# Patient Record
Sex: Female | Born: 1960 | Race: White | Hispanic: No | Marital: Married | Smoking: Never smoker
Health system: Southern US, Community
[De-identification: ages and names within clinical notes are randomized; demographics above are authoritative.]

## PROBLEM LIST (undated history)

## (undated) DIAGNOSIS — L237 Allergic contact dermatitis due to plants, except food: Secondary | ICD-10-CM

## (undated) DIAGNOSIS — K219 Gastro-esophageal reflux disease without esophagitis: Secondary | ICD-10-CM

## (undated) HISTORY — DX: Allergic contact dermatitis due to plants, except food: L23.7

## (undated) HISTORY — PX: TONSILLECTOMY AND ADENOIDECTOMY: SUR1326

## (undated) HISTORY — DX: Gastro-esophageal reflux disease without esophagitis: K21.9

---

## 1998-06-27 ENCOUNTER — Other Ambulatory Visit: Admission: RE | Admit: 1998-06-27 | Discharge: 1998-06-27 | Payer: Self-pay | Admitting: Obstetrics and Gynecology

## 1999-08-20 ENCOUNTER — Other Ambulatory Visit: Admission: RE | Admit: 1999-08-20 | Discharge: 1999-08-20 | Payer: Self-pay | Admitting: *Deleted

## 2000-08-18 ENCOUNTER — Other Ambulatory Visit: Admission: RE | Admit: 2000-08-18 | Discharge: 2000-08-18 | Payer: Self-pay | Admitting: Obstetrics and Gynecology

## 2001-08-27 ENCOUNTER — Other Ambulatory Visit: Admission: RE | Admit: 2001-08-27 | Discharge: 2001-08-27 | Payer: Self-pay | Admitting: Obstetrics and Gynecology

## 2002-09-01 ENCOUNTER — Other Ambulatory Visit: Admission: RE | Admit: 2002-09-01 | Discharge: 2002-09-01 | Payer: Self-pay | Admitting: Obstetrics and Gynecology

## 2003-09-20 ENCOUNTER — Other Ambulatory Visit: Admission: RE | Admit: 2003-09-20 | Discharge: 2003-09-20 | Payer: Self-pay | Admitting: Obstetrics and Gynecology

## 2004-09-24 ENCOUNTER — Other Ambulatory Visit: Admission: RE | Admit: 2004-09-24 | Discharge: 2004-09-24 | Payer: Self-pay | Admitting: Obstetrics and Gynecology

## 2005-10-10 ENCOUNTER — Other Ambulatory Visit: Admission: RE | Admit: 2005-10-10 | Discharge: 2005-10-10 | Payer: Self-pay | Admitting: Obstetrics & Gynecology

## 2006-10-22 ENCOUNTER — Other Ambulatory Visit: Admission: RE | Admit: 2006-10-22 | Discharge: 2006-10-22 | Payer: Self-pay | Admitting: Obstetrics & Gynecology

## 2007-05-04 ENCOUNTER — Encounter: Admission: RE | Admit: 2007-05-04 | Discharge: 2007-05-04 | Payer: Self-pay | Admitting: Obstetrics & Gynecology

## 2007-10-28 ENCOUNTER — Other Ambulatory Visit: Admission: RE | Admit: 2007-10-28 | Discharge: 2007-10-28 | Payer: Self-pay | Admitting: Obstetrics & Gynecology

## 2008-09-07 ENCOUNTER — Ambulatory Visit: Payer: Self-pay | Admitting: Family Medicine

## 2008-12-15 ENCOUNTER — Encounter: Admission: RE | Admit: 2008-12-15 | Discharge: 2008-12-15 | Payer: Self-pay | Admitting: Obstetrics & Gynecology

## 2010-05-14 ENCOUNTER — Ambulatory Visit: Payer: Self-pay | Admitting: Family Medicine

## 2011-05-29 ENCOUNTER — Other Ambulatory Visit: Payer: Self-pay | Admitting: Obstetrics & Gynecology

## 2011-05-29 DIAGNOSIS — Z1231 Encounter for screening mammogram for malignant neoplasm of breast: Secondary | ICD-10-CM

## 2011-06-09 ENCOUNTER — Ambulatory Visit
Admission: RE | Admit: 2011-06-09 | Discharge: 2011-06-09 | Disposition: A | Discharge status: Home / Self Care | Payer: BC Managed Care – PPO | Source: Ambulatory Visit | Attending: Obstetrics & Gynecology | Admitting: Obstetrics & Gynecology

## 2011-06-09 DIAGNOSIS — Z1231 Encounter for screening mammogram for malignant neoplasm of breast: Secondary | ICD-10-CM

## 2011-06-30 ENCOUNTER — Encounter: Payer: Self-pay | Admitting: Internal Medicine

## 2011-07-03 ENCOUNTER — Ambulatory Visit (INDEPENDENT_AMBULATORY_CARE_PROVIDER_SITE_OTHER): Payer: BC Managed Care – PPO | Admitting: Family Medicine

## 2011-07-03 ENCOUNTER — Encounter: Payer: Self-pay | Admitting: Family Medicine

## 2011-07-03 VITALS — BP 100/60 | HR 67 | Ht 66.0 in | Wt 125.0 lb

## 2011-07-03 DIAGNOSIS — D649 Anemia, unspecified: Secondary | ICD-10-CM

## 2011-07-03 NOTE — Progress Notes (Signed)
  Subjective:    Patient ID: Brandy Mcdonald, female    DOB: 1960-10-21, 51 y.o.   MRN: [id number]  HPI She is here for consult concerning multiple issues. For the last several months she has been involved in an exercise and dietary change program. She feels quite good about this. She does have a previous history of anemia and has not had this checked. She is on iron supplementation. She also notes a while exercising her heart rate will get up in the 150 range which makes the machine she is on flaccid she is going to pass. When she does have her heart up this far, she has no chest pain, shortness of breath. She also complains of dry skin.   Review of Systems     Objective:   Physical Exam Alert and in no distress. Multiple erythematous 1 cm lesions are noted especially on her lower extremities.       Assessment & Plan:  History of anemia. Probable dermatitis due to dry skin. I discussed her heart rate in regard to exercise with her in detail. Explained that she is not in any danger. The machine is set up under routine parameters and as long as she is not having any difficulty she can get her heart rate into the 120-130 range without difficulty. Will check a CBC on her. Also recommend over-the-counter lotion for her dry skin and to cut back on excessive showering with soap and water. Over 20 minutes spent discussing all these issues with her.

## 2011-07-04 LAB — CBC WITH DIFFERENTIAL/PLATELET
Eosinophils Absolute: 0.1 10*3/uL (ref 0.0–0.7)
Eosinophils Relative: 3 % (ref 0–5)
HCT: 38.9 % (ref 36.0–46.0)
Lymphocytes Relative: 28 % (ref 12–46)
Lymphs Abs: 1.3 10*3/uL (ref 0.7–4.0)
MCHC: 32.6 g/dL (ref 30.0–36.0)
Monocytes Absolute: 0.4 10*3/uL (ref 0.1–1.0)
Neutrophils Relative %: 61 % (ref 43–77)
RBC: 3.94 MIL/uL (ref 3.87–5.11)
RDW: 12.5 % (ref 11.5–15.5)

## 2011-07-10 ENCOUNTER — Telehealth: Payer: Self-pay | Admitting: Internal Medicine

## 2011-07-10 NOTE — Telephone Encounter (Signed)
Refer her to dermatology 

## 2011-07-10 NOTE — Telephone Encounter (Signed)
Pt has appt [facility] dermatology  07/16/11  2 2;30

## 2011-07-10 NOTE — Telephone Encounter (Signed)
Refer to dermatology 

## 2012-03-16 ENCOUNTER — Ambulatory Visit (INDEPENDENT_AMBULATORY_CARE_PROVIDER_SITE_OTHER): Payer: BC Managed Care – PPO | Admitting: Medical

## 2012-03-16 ENCOUNTER — Encounter: Payer: Self-pay | Admitting: Medical

## 2012-03-16 VITALS — BP 100/70 | HR 68 | Temp 97.8°F | Resp 16 | Wt 131.0 lb

## 2012-03-16 DIAGNOSIS — I889 Nonspecific lymphadenitis, unspecified: Secondary | ICD-10-CM

## 2012-03-16 DIAGNOSIS — R519 Headache, unspecified: Secondary | ICD-10-CM

## 2012-03-16 DIAGNOSIS — R51 Headache: Secondary | ICD-10-CM

## 2012-03-16 MED ORDER — MOMETASONE FUROATE 50 MCG/ACT NA SUSP: 2.0000 | Freq: Every day | NASAL | Status: DC

## 2012-03-16 MED ORDER — IBUPROFEN 400 MG PO TABS: 400.0000 mg | ORAL_TABLET | Freq: Four times a day (QID) | ORAL | Status: DC | PRN

## 2012-03-16 NOTE — Progress Notes (Signed)
  Subjective:   HPI  Brandy Mcdonald is a 51 y.o. female who presents for ear and neck and facial pain.   She notes a month ago was having left ear fullness and hearing decrease, worse after flying on a trip.   Went to employee health nurse who said she had fluid behind the ears.  Was given nasal spray which helped those symptoms.  Over the next few weeks she continued to have some fullness on the left ear, but in the last few days been having pain when chewing, a swollen lump in her left neck under and behind the ear that worsens with eating, but at times is not problematic. She denies current ear pain, ear drainage, no sinus pressure, no fever, no nausea, currently no hearing change, no teeth pain, no URI symptoms otherwise.   No prior similar issue, no hx/o TMJ.  Using Ibuprofen 400mg  TID given by employee health nurse which helps some.   She was advised to f/u with dentist.  No other aggravating or relieving factors.    No other c/o.  The following portions of the patient's history were reviewed and updated as appropriate: allergies, current medications, past family history, past medical history, past social history, past surgical history and problem list.  No past medical history on file.  Allergies  Allergen Reactions  . Penicillins     Review of Systems ROS reviewed and was negative other than noted in HPI or above.    Objective:   Physical Exam  General appearance: alert, no distress, WD/WN HEENT: normocephalic, sclerae anicteric, TMs somewhat flat, but no erythema or serous fluid, nares patent, no discharge or erythema, pharynx normal, nontender over TMJs, no obvious tenderness of pre and post auricular region, no mass, no lymphadenopathy Oral cavity: MMM, no lesions, no obvious tooth decay Neck: supple, no lymphadenopathy, no thyromegaly, no masses   Assessment and Plan :     Encounter Diagnoses  Name Primary?  . Lymphadenitis Yes  . Cephalalgia    Exam noncontributory today.    Symptoms suggest lymphadenitis leading to pain in left postauricular region as well as pain with chewing.   Possible eustachian tube dysfunction as well.  advised 5-7 day course of nasonex (sample given), c/t Ibuprofen 400mg  TID for the next 5-7 days, soft food diet for now and avoid meat and foods requiring heavy chewing.  symptoms should resolve in a week or so.  If not improving or worse, recheck.

## 2012-03-16 NOTE — Patient Instructions (Signed)
Lymphadenitis - inflammation of lymph nodes.  Nasonex nasal spray - begin 1 spray per nostril twice daily for about a week.  Increase your water intake this week.  Continue Ibuprofen 400mg  three times daily for about a week, or you can use OTC ibuprofen (200mg ), 2-4 tablets up to three times daily for pain and inflammation.  Use soft foods and liquid diet for now to calm down the pain with chewing.  Avoid meat and tough texture foods until this resolves.    Usually this process resolves in 1-2 weeks.    Cervical Adenitis You have a swollen lymph gland in your neck. This commonly happens with Strep and virus infections, dental problems, insect bites, and injuries about the face, scalp, or neck. The lymph glands swell as the body fights the infection or heals the injury. Swelling and firmness typically lasts for several weeks after the infection or injury is healed. Rarely lymph glands can become swollen because of cancer or TB. Antibiotics are prescribed if there is evidence of an infection. Sometimes an infected lymph gland becomes filled with pus. This condition may require opening up the abscessed gland by draining it surgically. Most of the time infected glands return to normal within two weeks. Do not poke or squeeze the swollen lymph nodes. That may keep them from shrinking back to their normal size. If the lymph gland is still swollen after 2 weeks, further medical evaluation is needed.  SEEK IMMEDIATE MEDICAL CARE IF:  You have difficulty swallowing or breathing, increased swelling, severe pain, or a high fever.  Document Released: 06/09/2005 Document Revised: 05/29/2011 Document Reviewed: 11/29/2006 [facility] Patient Information 2012 [place], [place].

## 2012-09-27 ENCOUNTER — Telehealth: Payer: Self-pay | Admitting: Obstetrics & Gynecology

## 2012-09-27 DIAGNOSIS — Z01419 Encounter for gynecological examination (general) (routine) without abnormal findings: Secondary | ICD-10-CM

## 2012-09-27 NOTE — Telephone Encounter (Signed)
Ok to refer to Dr. Loreta Ave for screening colonoscopy.  Order entered.  She does not need a referral for MMG.

## 2012-09-27 NOTE — Telephone Encounter (Signed)
Pt is calling in regards to a referral for a colonoscopy. She said Dr. Hyacinth Meeker had mentioned wanting her to have one done after she turned 51. She also wants to have her MMG done but is not sure if she needs a referral. Please advise.

## 2012-09-28 ENCOUNTER — Telehealth: Payer: Self-pay | Admitting: Orthopedic Surgery

## 2012-09-28 NOTE — Telephone Encounter (Signed)
Spoke to pt about scheduling colonoscopy with Dr. Loreta Ave. Instructed we would make the referral and be in touch about an appt time. Also instructed pt she can make her appt without an order or referral. Pt agreeable.  aa

## 2012-09-30 ENCOUNTER — Other Ambulatory Visit: Payer: Self-pay | Admitting: Obstetrics & Gynecology

## 2012-09-30 DIAGNOSIS — Z1231 Encounter for screening mammogram for malignant neoplasm of breast: Secondary | ICD-10-CM

## 2012-09-30 NOTE — Telephone Encounter (Signed)
PATIENT NOTIFIED OF DR. MILLER RESPONSE. APPT. MADE AND RECORD INFO FAXED TO [facility]. PATIENT NOTIFIED OF APPT. 10/21/2012@ 8:30am. PATIENT NOTIFIED OF NO REFERRAL NEEDED FOR MAMMOGRAM. STATES SHE WILL CALL HERSELF TO SET THAT UP BECAUSE WILL HAVE TO GO TO ANOTHER CLINIC DUE TO INSURANCE FOR IT TO BE DONE. SUE

## 2012-10-01 ENCOUNTER — Telehealth: Payer: Self-pay | Admitting: *Deleted

## 2012-10-01 NOTE — Telephone Encounter (Signed)
Forms faxed to Dr. Loreta Ave. Appt.  May ,01, 2014 @8 :30am. Patient notified. sue

## 2012-10-21 ENCOUNTER — Ambulatory Visit (HOSPITAL_COMMUNITY)
Admission: RE | Admit: 2012-10-21 | Discharge: 2012-10-21 | Disposition: A | Discharge status: Home / Self Care | Payer: BC Managed Care – PPO | Source: Ambulatory Visit | Attending: Obstetrics & Gynecology | Admitting: Obstetrics & Gynecology

## 2012-10-21 DIAGNOSIS — Z1231 Encounter for screening mammogram for malignant neoplasm of breast: Secondary | ICD-10-CM | POA: Insufficient documentation

## 2013-01-14 ENCOUNTER — Encounter: Payer: Self-pay | Admitting: Internal Medicine

## 2013-02-23 ENCOUNTER — Encounter: Payer: Self-pay | Admitting: Obstetrics & Gynecology

## 2013-02-23 ENCOUNTER — Ambulatory Visit: Payer: Self-pay | Admitting: Obstetrics & Gynecology

## 2013-02-24 ENCOUNTER — Ambulatory Visit (INDEPENDENT_AMBULATORY_CARE_PROVIDER_SITE_OTHER): Payer: BC Managed Care – PPO | Admitting: Obstetrics & Gynecology

## 2013-02-24 ENCOUNTER — Encounter: Payer: Self-pay | Admitting: Obstetrics & Gynecology

## 2013-02-24 VITALS — BP 116/58 | HR 60 | Resp 16 | Ht 66.5 in | Wt 135.6 lb

## 2013-02-24 DIAGNOSIS — Z01419 Encounter for gynecological examination (general) (routine) without abnormal findings: Secondary | ICD-10-CM

## 2013-02-24 DIAGNOSIS — Z0000 Encounter for general adult medical examination without abnormal findings: Secondary | ICD-10-CM

## 2013-02-24 LAB — HEMOGLOBIN, FINGERSTICK: Hemoglobin, fingerstick: 12.1 g/dL (ref 12.0–16.0)

## 2013-02-24 LAB — POCT URINALYSIS DIPSTICK
Nitrite, UA: NEGATIVE
Protein, UA: NEGATIVE
Urobilinogen, UA: NEGATIVE

## 2013-02-24 MED ORDER — LEVONORG-ETH ESTRAD TRIPHASIC PO TABS: 1.0000 | ORAL_TABLET | Freq: Every day | ORAL | Status: DC

## 2013-02-24 NOTE — Patient Instructions (Signed)

## 2013-02-24 NOTE — Progress Notes (Signed)
52 y.o. G0P0000 MarriedCaucasianF here for annual exam.  D/W pt 3D MMG due to Grade 3 breast densities.  On the pill and cycles are very light and short.    Patient's last menstrual period was 02/15/2013.          Sexually active: yes  The current method of family planning is OCP (estrogen/progesterone).    Exercising: yes  cardio and weights. Smoker:  no  Health Maintenance: Pap:  02/11/12 WNL/negative HR HPV History of abnormal Pap:  no MMG:  10/21/12 normal Colonoscopy:  2014 repeat in 10 years, Dr. Loreta Ave BMD:   none TDaP:  5/08 Screening Labs: TSH, Vit D 2013, CMP/Lipids 2012, Hb today: 12.1, Urine today: WBC-+1   reports that she has never smoked. She has never used smokeless tobacco. She reports that she does not drink alcohol or use illicit drugs.  Past Medical History  Diagnosis Date  . GERD (gastroesophageal reflux disease)     years ago-no meds    Past Surgical History  Procedure Laterality Date  . Tonsillectomy and adenoidectomy  age 59    Current Outpatient Prescriptions  Medication Sig Dispense Refill  . CETIRIZINE HCL PO Take by mouth.      . levonorgestrel-ethinyl estradiol (ENPRESSE,TRIVORA) tablet Take 1 tablet by mouth daily.      . Multiple Vitamin (MULTIVITAMIN) capsule Take 1 capsule by mouth daily.      Marland Kitchen OVER THE COUNTER MEDICATION Iron with vitamin C B-12 folic acid and copper take daily      . ibuprofen (ADVIL,MOTRIN) 400 MG tablet Take 1 tablet (400 mg total) by mouth every 6 (six) hours as needed for pain.  30 tablet  0   No current facility-administered medications for this visit.    History reviewed. No pertinent family history.  ROS:  Pertinent items are noted in HPI.  Otherwise, a comprehensive ROS was negative.  Exam:   BP 116/58  Pulse 60  Resp 16  Ht 5' 6.5" (1.689 m)  Wt 135 lb 9.6 oz (61.508 kg)  BMI 21.56 kg/m2  LMP 02/15/2013  Weight change: +3lbs   Height: 5' 6.5" (168.9 cm)  Ht Readings from Last 3 Encounters:  02/24/13 5' 6.5"  (1.689 m)  07/03/11 5\' 6"  (1.676 m)    General appearance: alert, cooperative and appears stated age Head: Normocephalic, without obvious abnormality, atraumatic Neck: no adenopathy, supple, symmetrical, trachea midline and thyroid normal to inspection and palpation Lungs: clear to auscultation bilaterally Breasts: normal appearance, no masses or tenderness Heart: regular rate and rhythm Abdomen: soft, non-tender; bowel sounds normal; no masses,  no organomegaly Extremities: extremities normal, atraumatic, no cyanosis or edema Skin: Skin color, texture, turgor normal. No rashes or lesions Lymph nodes: Cervical, supraclavicular, and axillary nodes normal. No abnormal inguinal nodes palpated Neurologic: Grossly normal   Pelvic: External genitalia:  no lesions              Urethra:  normal appearing urethra with no masses, tenderness or lesions              Bartholins and Skenes: normal                 Vagina: normal appearing vagina with normal color and discharge, no lesions, 2 anterior vaginal wall cysts--about a grape size in total size--unchanged              Cervix: no lesions              Pap taken: no  Bimanual Exam:  Uterus:  normal size, contour, position, consistency, mobility, non-tender              Adnexa: normal adnexa and no mass, fullness, tenderness               Rectovaginal: Confirms               Anus:  normal sphincter tone, no lesions  A:  Well Woman with normal exam Perimenopausal On OCPs Anterior vaginal wall cysts, stable  P:   Mammogram yearly.  D/W pt 3D MMG. pap smear with neg HR HPV 8/13.  No Pap today. Rx for Trivora to pharmacy return annually or prn  An After Visit Summary was printed and given to the patient.

## 2013-02-25 ENCOUNTER — Ambulatory Visit: Payer: Self-pay | Admitting: Obstetrics & Gynecology

## 2013-04-28 ENCOUNTER — Other Ambulatory Visit: Payer: Self-pay

## 2013-12-15 ENCOUNTER — Ambulatory Visit (INDEPENDENT_AMBULATORY_CARE_PROVIDER_SITE_OTHER): Payer: BC Managed Care – PPO | Admitting: Family Medicine

## 2013-12-15 ENCOUNTER — Encounter: Payer: Self-pay | Admitting: Family Medicine

## 2013-12-15 VITALS — BP 110/64 | HR 60 | Temp 97.9°F | Ht 67.0 in | Wt 134.0 lb

## 2013-12-15 DIAGNOSIS — L255 Unspecified contact dermatitis due to plants, except food: Secondary | ICD-10-CM

## 2013-12-15 DIAGNOSIS — L237 Allergic contact dermatitis due to plants, except food: Secondary | ICD-10-CM

## 2013-12-15 MED ORDER — PREDNISONE 20 MG PO TABS: ORAL_TABLET | ORAL | Status: DC

## 2013-12-15 NOTE — Patient Instructions (Addendum)
Poison [employer] ivy is a inflammation of the skin (contact dermatitis) caused by touching the allergens on the leaves of the ivy plant following previous exposure to the plant. The rash usually appears 48 hours after exposure. The rash is usually bumps (papules) or blisters (vesicles) in a linear pattern. Depending on your own sensitivity, the rash may simply cause redness and itching, or it may also progress to blisters which may break open. These must be well cared for to prevent secondary bacterial (germ) infection, followed by scarring. Keep any open areas dry, clean, dressed, and covered with an antibacterial ointment if needed. The eyes may also get puffy. The puffiness is worst in the morning and gets better as the day progresses. This dermatitis usually heals without scarring, within 2 to 3 weeks without treatment. HOME CARE INSTRUCTIONS  Thoroughly wash with soap and water as soon as you have been exposed to poison ivy. You have about one half hour to remove the plant resin before it will cause the rash. This washing will destroy the oil or antigen on the skin that is causing, or will cause, the rash. Be sure to wash under your fingernails as any plant resin there will continue to spread the rash. Do not rub skin vigorously when washing affected area. Poison ivy cannot spread if no oil from the plant remains on your body. A rash that has progressed to weeping sores will not spread the rash unless you have not washed thoroughly. It is also important to wash any clothes you have been wearing as these may carry active allergens. The rash will return if you wear the unwashed clothing, even several days later. Avoidance of the plant in the future is the best measure. Poison ivy plant can be recognized by the number of leaves. Generally, poison ivy has three leaves with flowering branches on a single stem. Diphenhydramine may be purchased over the counter and used as needed for itching. Do not drive with  this medication if it makes you drowsy.Ask your caregiver about medication for children. SEEK MEDICAL CARE IF:  Open sores develop.  Redness spreads beyond area of rash.  You notice purulent (pus-like) discharge.  You have increased pain.  Other signs of infection develop (such as fever). Document Released: 06/06/2000 Document Revised: 09/01/2011 Document Reviewed: 04/25/2009 [facility] Patient Information 2015 [place], [place]. This information is not intended to replace advice given to you by your health care provider. Make sure you discuss any questions you have with your health care provider.   Prednisone 20mg  tablet Take 3 tablets today Take 2 tablets Friday and Saturday Take 1.5 tablets on Sunday and Monday Take 1 tablet on Tuesday and Wednesday Take 1/2 tablet on Thursday, Friday, Saturday, Sunday  60/40/40/30/30/20/20//04/01/09/10  [id number] - [place], [place] - [address] AT [address]

## 2013-12-15 NOTE — Progress Notes (Signed)
Chief Complaint  Patient presents with  . Rash    thought she had an insect bite on left elbow area. Mowed grass Sunday. Saw company nurse Tuesday and was given a cream, did not work, in fact her more itchy. Rash is spreading and she concerned that she may have poison ivy. Also notes that about a month ago she had an allergic skin reaction to cashews-would like to maybe be tested for allergies at some point.    When she woke up on Monday she noticed an itchy bump on her left elbow, thought it was an insect bite.  She went to nurse at work, who gave her prescription steroid cream.  It didn't help, continued to itch, and has been spreading.  She had been in the yard the day before onset of rash, recalls grabbing a pile of sticks, possibly could have brushed up against poison ivy.  She has noticed spread up the left arm, spread to the right arm, and this morning noticed a spot on her nose. She has also had some itching at her waist.  Past Medical History  Diagnosis Date  . GERD (gastroesophageal reflux disease)     years ago-no meds   Past Surgical History  Procedure Laterality Date  . Tonsillectomy and adenoidectomy  age 52   History   Social History  . Marital Status: Married    Spouse Name: N/A    Number of Children: N/A  . Years of Education: N/A   Occupational History  . Not on file.   Social History Main Topics  . Smoking status: Never Smoker   . Smokeless tobacco: Never Used  . Alcohol Use: No  . Drug Use: No  . Sexual Activity: Yes    Partners: Male    Birth Control/ Protection: Pill   Other Topics Concern  . Not on file   Social History Narrative  . No narrative on file   Outpatient Encounter Prescriptions as of 12/15/2013  Medication Sig  . CETIRIZINE HCL PO Take by mouth.  . diphenhydrAMINE (BENADRYL) 25 MG tablet Take 25 mg by mouth 3 (three) times daily.  Marland Kitchen levonorgestrel-ethinyl estradiol (ENPRESSE,TRIVORA) tablet Take 1 tablet by mouth daily.  . Multiple  Vitamin (MULTIVITAMIN) capsule Take 1 capsule by mouth daily.  Marland Kitchen OVER THE COUNTER MEDICATION Iron with vitamin C G-40 folic acid and copper take daily  . ibuprofen (ADVIL,MOTRIN) 400 MG tablet Take 1 tablet (400 mg total) by mouth every 6 (six) hours as needed for pain.  . predniSONE (DELTASONE) 20 MG tablet Take as directed (separate sheet)   Allergies  Allergen Reactions  . Penicillins    ROS:  Denies fevers, chills, nausea, vomiting, bleeding/bruising, URI symptoms, chest pain, shortness of breath or other concerns.  See HPI  PHYSICAL EXAM: BP 110/64  Pulse 60  Temp(Src) 97.9 F (36.6 C) (Oral)  Ht 5\' 7"  (1.702 m)  Wt 134 lb (60.782 kg)  BMI 20.98 kg/m2  LMP 11/16/2013 Well developed, pleasant female in no distress  Skin: L elbow--excoriated area, with some surrounding inflammation and small vesicles.  Multiple excoriated papules, some linear excoriated vesicles, and a larger patch at the left wrist (raised, small vesicles, some excoriations/scabs).  R forearm--a few scattered papules.  Very small vesicle on the left side of her nose. No significant crusting, warmth, erythema  ASSESSMENT/PLAN:  Poison ivy dermatitis - Plan: predniSONE (DELTASONE) 20 MG tablet  Rash continues to spread, to areas not likely originally in contact with plant, and isn't  getting any benefit from topical therapy.  Reviewed risks/side effects of prednisone were reviewed in detail.   Prednisone 20mg  tablet Take 3 tablets today Take 2 tablets Friday and Saturday Take 1.5 tablets on Sunday and Monday Take 1 tablet on Tuesday and Wednesday Take 1/2 tablet on Thursday, Friday, Saturday, Sunday  60/40/40/30/30/20/20//04/01/09/10  Other topical supportive measures were reviewed, as well as preventive measures for future.

## 2013-12-29 ENCOUNTER — Telehealth: Payer: Self-pay | Admitting: Family Medicine

## 2013-12-29 NOTE — Telephone Encounter (Signed)
Yes--if she calls back next week, try and get more details re: new lesions, spreading, or same ones just still itchy and not resolving

## 2013-12-29 NOTE — Telephone Encounter (Signed)
It may not be completely gone, but shouldn't be getting new itchy areas.  If ongoing itching and rash, can take an additional 20mg  once daily x 5 days (vs just using topical meds prn for itching, along with antihistamine such as zyrtec/benadryl)

## 2013-12-29 NOTE — Telephone Encounter (Signed)
Spoke with patient, she is going to continue on zyrtec and use use topical meds-if not better by Monday she will call back and will try the 5 more days of prednisone. Is this ok?

## 2014-01-02 ENCOUNTER — Encounter: Payer: Self-pay | Admitting: Family Medicine

## 2014-01-02 ENCOUNTER — Ambulatory Visit (INDEPENDENT_AMBULATORY_CARE_PROVIDER_SITE_OTHER): Payer: BC Managed Care – PPO | Admitting: Family Medicine

## 2014-01-02 ENCOUNTER — Telehealth: Payer: Self-pay | Admitting: Family Medicine

## 2014-01-02 VITALS — BP 108/62 | HR 64 | Temp 98.2°F | Ht 67.0 in | Wt 137.0 lb

## 2014-01-02 DIAGNOSIS — L299 Pruritus, unspecified: Secondary | ICD-10-CM

## 2014-01-02 DIAGNOSIS — L237 Allergic contact dermatitis due to plants, except food: Secondary | ICD-10-CM

## 2014-01-02 DIAGNOSIS — L255 Unspecified contact dermatitis due to plants, except food: Secondary | ICD-10-CM

## 2014-01-02 DIAGNOSIS — IMO0002 Reserved for concepts with insufficient information to code with codable children: Secondary | ICD-10-CM

## 2014-01-02 MED ORDER — HYDROXYZINE HCL 25 MG PO TABS: 25.0000 mg | ORAL_TABLET | Freq: Four times a day (QID) | ORAL | Status: DC | PRN

## 2014-01-02 MED ORDER — SULFAMETHOXAZOLE-TMP DS 800-160 MG PO TABS: 1.0000 | ORAL_TABLET | Freq: Two times a day (BID) | ORAL | Status: DC

## 2014-01-02 MED ORDER — METHYLPREDNISOLONE ACETATE 80 MG/ML IJ SUSP
80.0000 mg | Freq: Once | INTRAMUSCULAR | Status: AC
  Administered 2014-01-02: 80 mg via INTRAMUSCULAR

## 2014-01-02 NOTE — Patient Instructions (Signed)
  Depo medrol injection today 80mg  (this is a steroid shot)--call if it gets worse, and needs to start oral prednisone again Septra DS is the antibiotic to treat skin infection (that I suspect is at the left elbow--redness/pain)  Use Atarax if needed for itching (to replace benadryl)--this may also cause drowsiness  Okay to continue calamine, if needed. Consider cool compresses  Consider Aveeno (oatmeal, topically)

## 2014-01-02 NOTE — Telephone Encounter (Signed)
Please have her come this afternoon

## 2014-01-02 NOTE — Telephone Encounter (Signed)
Please call, still having problems. She is miserable  She has new areas,  And it is spreading.  She got special soap and washed very good to try to make sure she got all the poison off  Skin is raw, bleeding    [facility]  [place]

## 2014-01-02 NOTE — Progress Notes (Signed)
Chief Complaint  Patient presents with  . Rash    poison ivy dermatitis not getting any better, in fact worsening and spreading slightly-still oozing after bathing. Really itchy and making her irritable.    She was originally seen 6/25 and diagnosed with suspected poison ivy dermatitis. She was treated with a steroid taper.  It didn't completely resolve, continued to have some residual rash and itching. She declined additional steroids the end of last week, preferring to stick with topicals and anti-histamines. She continued to use Zyrtec for the last 3-4 days, and noticed that her skin is raw, bleeding, and the rash is spreading just a little up the arm.  Involvement has mainly been on both upper extremities, she reports just a few on her trunk.  The ones on the nose resolved.  First thing in the morning, if the skin brushes up against anything, it gets itchy and she needs to scratch it, and can't hold back.  She has been taking benadryl, which maybe helps some. Also taking zyrtec daily.  She has also been using calamine lotion regularly, while at work, which helps.  Past Medical History  Diagnosis Date  . GERD (gastroesophageal reflux disease)     years ago-no meds   Past Surgical History  Procedure Laterality Date  . Tonsillectomy and adenoidectomy  age 53   History   Social History  . Marital Status: Married    Spouse Name: N/A    Number of Children: N/A  . Years of Education: N/A   Occupational History  . Not on file.   Social History Main Topics  . Smoking status: Never Smoker   . Smokeless tobacco: Never Used  . Alcohol Use: No  . Drug Use: No  . Sexual Activity: Yes    Partners: Male    Birth Control/ Protection: Pill   Other Topics Concern  . Not on file   Social History Narrative  . No narrative on file    Outpatient Encounter Prescriptions as of 01/02/2014  Medication Sig  . CETIRIZINE HCL PO Take by mouth.  . diphenhydrAMINE (BENADRYL) 25 MG tablet Take 25  mg by mouth 3 (three) times daily.  Marland Kitchen levonorgestrel-ethinyl estradiol (ENPRESSE,TRIVORA) tablet Take 1 tablet by mouth daily.  . Multiple Vitamin (MULTIVITAMIN) capsule Take 1 capsule by mouth daily.  Marland Kitchen OVER THE COUNTER MEDICATION Iron with vitamin C T-73 folic acid and copper take daily  . ibuprofen (ADVIL,MOTRIN) 400 MG tablet Take 1 tablet (400 mg total) by mouth every 6 (six) hours as needed for pain.  . [DISCONTINUED] predniSONE (DELTASONE) 20 MG tablet Take as directed (separate sheet)   Allergies  Allergen Reactions  . Penicillins    ROS:  No fevers, chills, nausea, vomiting, GI complaints, bleeding, bruising, URI symptoms, chest pain, shortness of breath or other concerns.  See HPI  PHYSICAL EXAM: BP 108/62  Pulse 64  Temp(Src) 98.2 F (36.8 C) (Tympanic)  Ht 5\' 7"  (1.702 m)  Wt 137 lb (62.143 kg)  BMI 21.45 kg/m2  LMP 11/16/2013 Well developed, pleasant female in no distress. She appears comfortable, not scratching during visit.  Excoriated lesions at both forearms, elbow and upper arms. All lesions are de-roofed and/or scabbed. The area at the left elbow (original lesion) has some surrounding soft tissue swelling and appears more pink.  Other areas of skin do not have swelling or redness between the excoriated lesions.  There is no honey-crusting.  ASSESSMENT/PLAN:  Itching - Plan: hydrOXYzine (ATARAX/VISTARIL) 25 MG tablet, methylPREDNISolone  acetate (DEPO-MEDROL) injection 80 mg  Poison ivy dermatitis - Plan: methylPREDNISolone acetate (DEPO-MEDROL) injection 80 mg  Cellulitis and abscess of upper arm and forearm - Plan: sulfamethoxazole-trimethoprim (BACTRIM DS) 800-160 MG per tablet, methylPREDNISolone acetate (DEPO-MEDROL) injection 80 mg  Poison ivy dermatitis--very excoriated with possible secondary infection.  Very pruritic and hasn't healed.  Depo medrol injection today 80mg  Septra DS to treat superinfection (PCN allergy). Atarax prn itching (to replace  benadryl)  Okay to continue calamine, if needed. Consider cool compresses  Consider Aveeno (oatmeal, topically)

## 2014-03-13 ENCOUNTER — Encounter: Payer: Self-pay | Admitting: Obstetrics & Gynecology

## 2014-03-13 ENCOUNTER — Ambulatory Visit (INDEPENDENT_AMBULATORY_CARE_PROVIDER_SITE_OTHER): Payer: BC Managed Care – PPO | Admitting: Obstetrics & Gynecology

## 2014-03-13 VITALS — BP 112/60 | HR 56 | Resp 16 | Ht 66.5 in | Wt 137.0 lb

## 2014-03-13 DIAGNOSIS — N951 Menopausal and female climacteric states: Secondary | ICD-10-CM

## 2014-03-13 DIAGNOSIS — Z124 Encounter for screening for malignant neoplasm of cervix: Secondary | ICD-10-CM

## 2014-03-13 DIAGNOSIS — Z0000 Encounter for general adult medical examination without abnormal findings: Secondary | ICD-10-CM

## 2014-03-13 DIAGNOSIS — Z01419 Encounter for gynecological examination (general) (routine) without abnormal findings: Secondary | ICD-10-CM

## 2014-03-13 LAB — POCT URINALYSIS DIPSTICK
Bilirubin, UA: NEGATIVE
Glucose, UA: NEGATIVE
Ketones, UA: NEGATIVE
Nitrite, UA: NEGATIVE
PH UA: 5
Urobilinogen, UA: NEGATIVE

## 2014-03-13 NOTE — Progress Notes (Signed)
53 y.o. G0P0000 MarriedCaucasianF here for annual exam.  Doing well.  Had significant issue with poison-ivy this summer.  Needed steroid injection.    Patient's last menstrual period was 03/12/2014.          Sexually active: Yes.    The current method of family planning is OCP (estrogen/progesterone).    Exercising: Yes.    cardio, weights, housework, and mowing Smoker:  no  Health Maintenance: Pap:  02/11/12 WNL/negative HR HPV History of abnormal Pap:  no MMG:  10/21/12-normal Colonoscopy:  2014-repeat in 5 years BMD:   none TDaP:  5/08 Screening Labs: today, Hb today: 11.9, Urine today: WBC-1+, PROTEIN-trace, RBC-2+ (on cycle)   reports that she has never smoked. She has never used smokeless tobacco. She reports that she does not drink alcohol or use illicit drugs.  Past Medical History  Diagnosis Date  . GERD (gastroesophageal reflux disease)     years ago-no meds  . Poison ivy     over the summer, ? if truly poison ivy-2 rounds of steroids    Past Surgical History  Procedure Laterality Date  . Tonsillectomy and adenoidectomy  age 61    Current Outpatient Prescriptions  Medication Sig Dispense Refill  . CETIRIZINE HCL PO Take by mouth.      . diphenhydrAMINE (BENADRYL) 25 MG tablet Take 25 mg by mouth 3 (three) times daily.      Marland Kitchen levonorgestrel-ethinyl estradiol (ENPRESSE,TRIVORA) tablet Take 1 tablet by mouth daily.  3 Package  4  . Multiple Vitamin (MULTIVITAMIN) capsule Take 1 capsule by mouth daily.      Marland Kitchen OVER THE COUNTER MEDICATION Iron with vitamin C E-42 folic acid and copper take daily      . ibuprofen (ADVIL,MOTRIN) 400 MG tablet Take 1 tablet (400 mg total) by mouth every 6 (six) hours as needed for pain.  30 tablet  0   No current facility-administered medications for this visit.    History reviewed. No pertinent family history.  ROS:  Pertinent items are noted in HPI.  Otherwise, a comprehensive ROS was negative.  Exam:   BP 112/60  Pulse 56  Resp 16   Ht 5' 6.5" (1.689 m)  Wt 137 lb (62.143 kg)  BMI 21.78 kg/m2  LMP 03/12/2014  Weight change:+1#   Height: 5' 6.5" (168.9 cm)  Ht Readings from Last 3 Encounters:  03/13/14 5' 6.5" (1.689 m)  01/02/14 5\' 7"  (1.702 m)  12/15/13 5\' 7"  (1.702 m)    General appearance: alert, cooperative and appears stated age Head: Normocephalic, without obvious abnormality, atraumatic Neck: no adenopathy, supple, symmetrical, trachea midline and thyroid normal to inspection and palpation Lungs: clear to auscultation bilaterally Breasts: normal appearance, no masses or tenderness Heart: regular rate and rhythm Abdomen: soft, non-tender; bowel sounds normal; no masses,  no organomegaly Extremities: extremities normal, atraumatic, no cyanosis or edema Skin: Skin color, texture, turgor normal. No rashes or lesions Lymph nodes: Cervical, supraclavicular, and axillary nodes normal. No abnormal inguinal nodes palpated Neurologic: Grossly normal   Pelvic: External genitalia:  no lesions              Urethra:  normal appearing urethra with no masses, tenderness or lesions              Bartholins and Skenes: normal                 Vagina: normal appearing vagina with normal color and discharge, no lesions  Cervix: no lesions              Pap taken: Yes.   Bimanual Exam:  Uterus:  normal size, contour, position, consistency, mobility, non-tender              Adnexa: normal adnexa and no mass, fullness, tenderness               Rectovaginal: Confirms               Anus:  normal sphincter tone, no lesions  A:  Well Woman with normal exam  Perimenopausal  On OCPs  Anterior vaginal wall cysts, stable   P: Mammogram yearly. D/W pt 3D MMG.  pap smear with neg HR HPV 8/13.   Pap today [place] today.  Will lower estrogen dosage in OCPs pending results of FSH. CBC, CMP, lipids, TSH today return annually or prn  An After Visit Summary was printed and given to the patient.

## 2014-03-14 LAB — CBC
HCT: 34.9 % — ABNORMAL LOW (ref 36.0–46.0)
Hemoglobin: 11.8 g/dL — ABNORMAL LOW (ref 12.0–15.0)
MCH: 32.2 pg (ref 26.0–34.0)
MCHC: 33.8 g/dL (ref 30.0–36.0)
MCV: 95.1 fL (ref 78.0–100.0)
Platelets: 302 10*3/uL (ref 150–400)
RBC: 3.67 MIL/uL — ABNORMAL LOW (ref 3.87–5.11)
RDW: 12.8 % (ref 11.5–15.5)
WBC: 5.5 10*3/uL (ref 4.0–10.5)

## 2014-03-14 LAB — FOLLICLE STIMULATING HORMONE: FSH: 13.6 m[IU]/mL

## 2014-03-14 LAB — COMPREHENSIVE METABOLIC PANEL
ALBUMIN: 4.2 g/dL (ref 3.5–5.2)
ALT: 23 U/L (ref 0–35)
AST: 22 U/L (ref 0–37)
Alkaline Phosphatase: 40 U/L (ref 39–117)
BILIRUBIN TOTAL: 0.3 mg/dL (ref 0.2–1.2)
BUN: 11 mg/dL (ref 6–23)
CALCIUM: 9.1 mg/dL (ref 8.4–10.5)
CO2: 30 mEq/L (ref 19–32)
Chloride: 100 mEq/L (ref 96–112)
Creat: 0.62 mg/dL (ref 0.50–1.10)
Glucose, Bld: 80 mg/dL (ref 70–99)
POTASSIUM: 4 meq/L (ref 3.5–5.3)
Sodium: 138 mEq/L (ref 135–145)
TOTAL PROTEIN: 6.3 g/dL (ref 6.0–8.3)

## 2014-03-14 LAB — TSH: TSH: 0.625 u[IU]/mL (ref 0.350–4.500)

## 2014-03-14 LAB — LIPID PANEL
Cholesterol: 180 mg/dL (ref 0–200)
HDL: 45 mg/dL (ref >39)
LDL CALC: 112 mg/dL — AB (ref 0–99)
TRIGLYCERIDES: 117 mg/dL (ref <150)
Total CHOL/HDL Ratio: 4 Ratio
VLDL: 23 mg/dL (ref 0–40)

## 2014-03-14 LAB — HEMOGLOBIN, FINGERSTICK: Hemoglobin, fingerstick: 11.9 g/dL — ABNORMAL LOW (ref 12.0–16.0)

## 2014-03-15 LAB — IPS PAP TEST WITH REFLEX TO HPV

## 2014-03-17 ENCOUNTER — Telehealth: Payer: Self-pay

## 2014-03-17 MED ORDER — LEVONORGESTREL-ETHINYL ESTRAD 0.1-20 MG-MCG PO TABS: 1.0000 | ORAL_TABLET | Freq: Every day | ORAL | Status: DC

## 2014-03-17 NOTE — Telephone Encounter (Signed)
Message copied by Robley Fries on Fri Mar 17, 2014 11:29 AM ------      Message from: Megan Salon      Created: Fri Mar 17, 2014  4:46 AM       Inform pt FSH is 13 is not in menopausal range.  Changing OCPs from [place] to Alesse (or generic equivalent).  Same estrogen, same progesterone just in lower dosages.  Did 90 days supply.  She needs to call with any symptoms after change in pills.            Also inform CBC nl, Lipids fine except LDL mildly elevated at 112--no treatment needed, CMP nl, TSH normal.  02 pap recall. ------

## 2014-03-17 NOTE — Telephone Encounter (Signed)
Lmtcb//kn 

## 2014-03-17 NOTE — Addendum Note (Signed)
Addended by: Megan Salon on: 03/17/2014 04:47 AM   Modules accepted: Orders

## 2014-04-24 NOTE — Telephone Encounter (Signed)
Patient notified of all results.//kn 

## 2014-08-11 ENCOUNTER — Telehealth: Payer: Self-pay | Admitting: Obstetrics & Gynecology

## 2014-08-11 NOTE — Telephone Encounter (Signed)
Patient has a question for Dr.Miller's nurse. No details given. Last seen 03/13/14.

## 2014-08-11 NOTE — Telephone Encounter (Signed)
Spoke with patient. Patient states that she is calling to give Dr.Miller an update on how she is doing on Alesse. "I just wanted to let her know that since I started on it 3 months or so ago I have been doing fine." Patient also has a form for work that she is requesting be filled out and signed but Dr.Miller to earn "points." "It just asked for my weight, height, and blood pressure from my last visit. It says it can be from a previous visit last year." Patient was last seen for annual on 03/13/2014 with Dr.Miller. Patient is going to fax form to office for completion. Advised will let Dr.Miller know and someone will notify her once form is completed. Patient is agreeable.  Routing to provider for final review. Patient agreeable to disposition. Will close encounter

## 2014-08-21 ENCOUNTER — Telehealth: Payer: Self-pay

## 2014-08-21 NOTE — Telephone Encounter (Signed)
Patient aware Dr Sabra Heck is out and will fax form this afternoon.(fax is in your box). Also, wanted to let Dr Sabra Heck know that she is doing well on the lower dose of OCP. No change or side effects.//kn

## 2014-08-21 NOTE — Telephone Encounter (Signed)
This has been signed.  Please fax first thing Tues am if possible.  Is on your desk.

## 2014-08-21 NOTE — Telephone Encounter (Signed)
Lmtcb//kn 

## 2014-08-22 NOTE — Telephone Encounter (Signed)
Form has been faxed to patient.//kn

## 2015-01-29 ENCOUNTER — Other Ambulatory Visit: Payer: Self-pay

## 2015-01-29 DIAGNOSIS — Z1231 Encounter for screening mammogram for malignant neoplasm of breast: Secondary | ICD-10-CM

## 2015-02-02 ENCOUNTER — Ambulatory Visit: Payer: Self-pay

## 2015-02-02 ENCOUNTER — Ambulatory Visit
Admission: RE | Admit: 2015-02-02 | Discharge: 2015-02-02 | Disposition: A | Discharge status: Home / Self Care | Payer: BLUE CROSS/BLUE SHIELD | Source: Ambulatory Visit

## 2015-02-02 DIAGNOSIS — Z1231 Encounter for screening mammogram for malignant neoplasm of breast: Secondary | ICD-10-CM

## 2015-04-07 ENCOUNTER — Other Ambulatory Visit: Payer: Self-pay | Admitting: Obstetrics & Gynecology

## 2015-04-09 NOTE — Telephone Encounter (Signed)
Medication refill request: OCP Last AEX:  03/13/14 SM Next AEX: 04/24/15 SM Last MMG (if hormonal medication request): 05/08/15 BIRADS2:benign  Refill authorized: 03/17/14 #[id number]/ 4R. Today please advise.

## 2015-04-24 ENCOUNTER — Ambulatory Visit (INDEPENDENT_AMBULATORY_CARE_PROVIDER_SITE_OTHER): Payer: BLUE CROSS/BLUE SHIELD | Admitting: Obstetrics & Gynecology

## 2015-04-24 ENCOUNTER — Encounter: Payer: Self-pay | Admitting: Obstetrics & Gynecology

## 2015-04-24 VITALS — BP 106/62 | HR 68 | Resp 16 | Ht 66.5 in | Wt 138.0 lb

## 2015-04-24 DIAGNOSIS — N951 Menopausal and female climacteric states: Secondary | ICD-10-CM | POA: Diagnosis not present

## 2015-04-24 DIAGNOSIS — Z01419 Encounter for gynecological examination (general) (routine) without abnormal findings: Secondary | ICD-10-CM

## 2015-04-24 DIAGNOSIS — Z0000 Encounter for general adult medical examination without abnormal findings: Secondary | ICD-10-CM | POA: Diagnosis not present

## 2015-04-24 LAB — POCT URINALYSIS DIPSTICK
BILIRUBIN UA: NEGATIVE
Blood, UA: NEGATIVE
Glucose, UA: NEGATIVE
Ketones, UA: NEGATIVE
NITRITE UA: NEGATIVE
PH UA: 5
Protein, UA: NEGATIVE
Urobilinogen, UA: NEGATIVE

## 2015-04-24 MED ORDER — LEVONORGESTREL-ETHINYL ESTRAD 0.1-20 MG-MCG PO TABS: 1.0000 | ORAL_TABLET | Freq: Every day | ORAL | Status: DC

## 2015-04-24 NOTE — Progress Notes (Addendum)
54 y.o. G0P0000 MarriedCaucasianF here for annual exam.  Doing well.  Flow is very light.  Having minimal cramping.  Does occasionally have some night sweats.  Having a little more irritability.  Went to [place] in April for 25th anniversary.    Pt states she can now feel the anterior vaginal wall cysts with intercourse.  Would like to consider having them removed.  Surgical procedure for this discussed.  PCP:  Dr. Ma Hillock.    Patient's last menstrual period was 04/09/2015.          Sexually active: Yes.    The current method of family planning is OCP (estrogen/progesterone).    Exercising: Yes.    cardio and walking Smoker:  no  Health Maintenance: Pap:  03/13/14 WNL, 8/13 neg with neg HR HPV History of abnormal Pap:  no MMG:  02/02/15 BiRads2-Benign Colonoscopy:  2014-repeat in 5 years BMD:   none TDaP:  5/08 Screening Labs: last year, Hb today: not done, Urine today: WBC-trace   reports that she has never smoked. She has never used smokeless tobacco. She reports that she does not drink alcohol or use illicit drugs.  Past Medical History  Diagnosis Date  . GERD (gastroesophageal reflux disease)     years ago-no meds  . Poison ivy     over the summer, ? if truly poison ivy-2 rounds of steroids    Past Surgical History  Procedure Laterality Date  . Tonsillectomy and adenoidectomy  age 37    Current Outpatient Prescriptions  Medication Sig Dispense Refill  . diphenhydrAMINE (BENADRYL) 25 MG tablet Take 25 mg by mouth 3 (three) times daily.    Marland Kitchen ibuprofen (ADVIL,MOTRIN) 400 MG tablet Take 1 tablet (400 mg total) by mouth every 6 (six) hours as needed for pain. 30 tablet 0  . levonorgestrel-ethinyl estradiol (AVIANE,ALESSE,LESSINA) 0.1-20 MG-MCG tablet TAKE 1 TABLET DAILY 28 tablet 0  . OVER THE COUNTER MEDICATION Iron with vitamin C D-92 folic acid and copper take daily     No current facility-administered medications for this visit.    No family history on file.  ROS:   Pertinent items are noted in HPI.  Otherwise, a comprehensive ROS was negative.  Exam:   General appearance: alert, cooperative and appears stated age Head: Normocephalic, without obvious abnormality, atraumatic Neck: no adenopathy, supple, symmetrical, trachea midline and thyroid normal to inspection and palpation Lungs: clear to auscultation bilaterally Breasts: normal appearance, no masses or tenderness Heart: regular rate and rhythm Abdomen: soft, non-tender; bowel sounds normal; no masses,  no organomegaly Extremities: extremities normal, atraumatic, no cyanosis or edema Skin: Skin color, texture, turgor normal. No rashes or lesions Lymph nodes: Cervical, supraclavicular, and axillary nodes normal. No abnormal inguinal nodes palpated Neurologic: Grossly normal   Pelvic: External genitalia:  no lesions              Urethra:  normal appearing urethra with no masses, tenderness or lesions              Bartholins and Skenes: normal                 Vagina: normal appearing vagina with normal color and discharge, two anterior vaginal walls cysts, left one 2.5cm and right 2.0cm              Cervix: no lesions              Pap taken: No. Bimanual Exam:  Uterus:  normal size, contour, position, consistency, mobility, non-tender  Adnexa: normal adnexa and no mass, fullness, tenderness               Rectovaginal: Confirms               Anus:  normal sphincter tone, no lesions  Chaperone was present for exam.  A:  Well Woman with normal exam  Perimenopausal with significant decrease in bleeding this past year On OCPs  Anterior vaginal wall cysts, slightly increased in size, desirous of removal.  Will call back next year once insurance renews to schedule.   P: Mammogram yearly.  pap smear with neg HR HPV 8/13.  Neg pap 2015.  [place] today.  Will plan to stop OCPs once FSH is in menopausal range Planscreening labs next year return annually or prn

## 2015-04-24 NOTE — Addendum Note (Signed)
Addended by: Megan Salon on: 04/24/2015 02:23 PM   Modules accepted: Miquel Dunn

## 2015-04-25 LAB — FOLLICLE STIMULATING HORMONE: FSH: 7.9 m[IU]/mL

## 2016-01-07 ENCOUNTER — Encounter: Payer: Self-pay | Admitting: Family Medicine

## 2016-01-07 ENCOUNTER — Ambulatory Visit (INDEPENDENT_AMBULATORY_CARE_PROVIDER_SITE_OTHER): Payer: BLUE CROSS/BLUE SHIELD | Admitting: Family Medicine

## 2016-01-07 VITALS — BP 110/64 | HR 60 | Temp 98.0°F | Wt 140.0 lb

## 2016-01-07 DIAGNOSIS — R21 Rash and other nonspecific skin eruption: Secondary | ICD-10-CM

## 2016-01-07 MED ORDER — TRIAMCINOLONE ACETONIDE 0.1 % EX CREA: 1.0000 "application " | TOPICAL_CREAM | Freq: Two times a day (BID) | CUTANEOUS | Status: DC

## 2016-01-07 NOTE — Patient Instructions (Signed)
Keep me posted if you notice any worsening rash or signs of infection. Use the topical steroid to the area twice daily. Take benadryl as needed for itching. Use cool compresses for itching also.

## 2016-01-07 NOTE — Progress Notes (Signed)
   Subjective:    Patient ID: Brandy Mcdonald, female    DOB: 03/04/1961, 55 y.o.   MRN: [id number]  HPI Chief Complaint  Patient presents with  . poison ivy    poison ivy-- got it last monday   She is here with complaints of a 1 week history of rash to her forearms that started after working in her garden.  Denies itching unless she "has a hot flash". Denies pain.   Has been using hydrocortisone 1% that was prescribed by the nurse at work and calamine lotion. States symptoms have improved some. Questions whether she needs a steroid injection.   Denies fever, chills, nausea, vomiting.    Review of Systems Pertinent positives and negatives in the history of present illness.     Objective:   Physical Exam BP 110/64 mmHg  Pulse 60  Temp(Src) 98 F (36.7 C) (Oral)  Wt 140 lb (63.504 kg)  Left posterior forearm with patch of erythema, no vesicles, induration or fluctuance. Left anterior forearm with discrete pinpoint raised rash in somewhat linear pattern.       Assessment & Plan:  Rash and nonspecific skin eruption  Discussed that she appears to have a contact dermatitis and I recommend topical triamcinolone twice daily. I do not think oral or IM steroids is appropriate based on exam today and advised patient of this. Discussed signs of infection and she will let me know if the area worsens. She may use cool compresses and Benadryl as needed for itching. Follow up as needed.

## 2016-01-15 ENCOUNTER — Ambulatory Visit (INDEPENDENT_AMBULATORY_CARE_PROVIDER_SITE_OTHER): Payer: BLUE CROSS/BLUE SHIELD | Admitting: Family Medicine

## 2016-01-15 ENCOUNTER — Telehealth: Payer: Self-pay | Admitting: Family Medicine

## 2016-01-15 VITALS — BP 112/60 | HR 65 | Wt 139.6 lb

## 2016-01-15 DIAGNOSIS — L259 Unspecified contact dermatitis, unspecified cause: Secondary | ICD-10-CM

## 2016-01-15 MED ORDER — TRIAMCINOLONE ACETONIDE 0.1 % EX CREA: 1.0000 "application " | TOPICAL_CREAM | Freq: Two times a day (BID) | CUTANEOUS | 0 refills | Status: DC

## 2016-01-15 NOTE — Telephone Encounter (Signed)
Pt coming in

## 2016-01-15 NOTE — Telephone Encounter (Signed)
I'm going to need to see her to assess this.

## 2016-01-15 NOTE — Telephone Encounter (Signed)
Pt states was seen by Vickie a week ago & was told to call if symptoms didn't improve & she was given Triamcinolone Acetonide cream for a rash and it cleared the rash on her hands, but now has a rash on her legs and the rash is moving around & getting new bumps.  Doesn't know if it's something in her blood or something else.  Itches occasionally. She would like a call back with what you suggest

## 2016-01-15 NOTE — Progress Notes (Signed)
   Subjective:    Patient ID: Brandy Mcdonald, female    DOB: 1961-05-22, 55 y.o.   MRN: [id number]  HPI She is here for a recheck on her rash. She now notes that it is spreading to other areas on her arms and on her legs.   Review of Systems     Objective:   Physical Exam scattered erythematous lesions noted on both arms and in the right posterior knee area.      Assessment & Plan:  Contact dermatitis - Plan: triamcinolone cream (KENALOG) 0.1 % I explained that this is most likely poison ivy. Recommended cool compresses, steroids and Benadryl at night. Discussed this spread of this in terms of we exposure and in her case smaller concentrations ahead taken longer to manifest itself.

## 2016-02-04 ENCOUNTER — Other Ambulatory Visit: Payer: Self-pay | Admitting: Obstetrics & Gynecology

## 2016-02-04 DIAGNOSIS — Z1231 Encounter for screening mammogram for malignant neoplasm of breast: Secondary | ICD-10-CM

## 2016-02-18 ENCOUNTER — Ambulatory Visit: Payer: BLUE CROSS/BLUE SHIELD

## 2016-04-11 ENCOUNTER — Ambulatory Visit
Admission: RE | Admit: 2016-04-11 | Discharge: 2016-04-11 | Disposition: A | Discharge status: Home / Self Care | Payer: BLUE CROSS/BLUE SHIELD | Source: Ambulatory Visit | Attending: Obstetrics & Gynecology | Admitting: Obstetrics & Gynecology

## 2016-04-11 DIAGNOSIS — Z1231 Encounter for screening mammogram for malignant neoplasm of breast: Secondary | ICD-10-CM

## 2016-04-30 ENCOUNTER — Ambulatory Visit: Payer: BLUE CROSS/BLUE SHIELD | Admitting: Obstetrics and Gynecology

## 2016-05-12 ENCOUNTER — Encounter: Payer: Self-pay | Admitting: Obstetrics & Gynecology

## 2016-05-12 ENCOUNTER — Ambulatory Visit (INDEPENDENT_AMBULATORY_CARE_PROVIDER_SITE_OTHER): Payer: BLUE CROSS/BLUE SHIELD | Admitting: Obstetrics & Gynecology

## 2016-05-12 VITALS — BP 102/58 | HR 80 | Resp 14 | Ht 66.25 in | Wt 136.0 lb

## 2016-05-12 DIAGNOSIS — N841 Polyp of cervix uteri: Secondary | ICD-10-CM

## 2016-05-12 DIAGNOSIS — Z01411 Encounter for gynecological examination (general) (routine) with abnormal findings: Secondary | ICD-10-CM | POA: Diagnosis not present

## 2016-05-12 DIAGNOSIS — N951 Menopausal and female climacteric states: Secondary | ICD-10-CM | POA: Diagnosis not present

## 2016-05-12 DIAGNOSIS — Z205 Contact with and (suspected) exposure to viral hepatitis: Secondary | ICD-10-CM | POA: Diagnosis not present

## 2016-05-12 DIAGNOSIS — Z124 Encounter for screening for malignant neoplasm of cervix: Secondary | ICD-10-CM

## 2016-05-12 DIAGNOSIS — E559 Vitamin D deficiency, unspecified: Secondary | ICD-10-CM

## 2016-05-12 DIAGNOSIS — B977 Papillomavirus as the cause of diseases classified elsewhere: Secondary | ICD-10-CM

## 2016-05-12 DIAGNOSIS — Z0000 Encounter for general adult medical examination without abnormal findings: Secondary | ICD-10-CM | POA: Diagnosis not present

## 2016-05-12 LAB — HEMOGLOBIN, FINGERSTICK: HEMOGLOBIN, FINGERSTICK: 12 g/dL (ref 12.0–16.0)

## 2016-05-12 LAB — POCT URINALYSIS DIPSTICK
Bilirubin, UA: NEGATIVE
Blood, UA: NEGATIVE
Glucose, UA: NEGATIVE
Ketones, UA: NEGATIVE
Nitrite, UA: NEGATIVE
PROTEIN UA: NEGATIVE
UROBILINOGEN UA: NEGATIVE
pH, UA: 5

## 2016-05-12 NOTE — Progress Notes (Signed)
55 y.o. G0P0000 MarriedCaucasianF here for annual exam.  Doing well.  Pt having very minimal bleeding with her cycles.  Still cycling every month but very minimal bleeding.  Having hot flashes and night sweats during her placebo week.  Also having some sleep issues.  Patient's last menstrual period was 05/03/2016 (approximate).          Sexually active: No.  The current method of family planning is OCP (estrogen/progesterone).    Exercising: No.  The patient does not participate in regular exercise at present. Smoker:  no  Health Maintenance: Pap:  03/13/14 Neg. 01/2012 Neg. HR HPV:neg  History of abnormal Pap:  no MMG:  04/14/16 BIRADS1:neg  Colonoscopy:  01/14/13 Polyps, follow  BMD:   Never TDaP:  10/2006  Pneumonia vaccine(s):  No Zostavax:   No Hep C testing: done today Screening Labs: today, Hb today: 12.0, Urine today: WBC=Trace   reports that she has never smoked. She has never used smokeless tobacco. She reports that she does not drink alcohol or use drugs.  Past Medical History:  Diagnosis Date  . GERD (gastroesophageal reflux disease)    years ago-no meds  . Poison ivy    over the summer, ? if truly poison ivy-2 rounds of steroids  . Poison ivy     Past Surgical History:  Procedure Laterality Date  . TONSILLECTOMY AND ADENOIDECTOMY  age 66    Current Outpatient Prescriptions  Medication Sig Dispense Refill  . levonorgestrel-ethinyl estradiol (AVIANE,ALESSE,LESSINA) 0.1-20 MG-MCG tablet Take 1 tablet by mouth daily. 3 Package 4  . OVER THE COUNTER MEDICATION Iron with vitamin C 0000000 folic acid and copper take daily     No current facility-administered medications for this visit.     History reviewed. No pertinent family history.  ROS:  Pertinent items are noted in HPI.  Otherwise, a comprehensive ROS was negative.  Exam:   BP (!) 102/58 (BP Location: Right Arm, Patient Position: Sitting, Cuff Size: Normal)   Pulse 80   Resp 14   Ht 5' 6.25" (1.683 m)   Wt  136 lb (61.7 kg)   LMP 05/03/2016 (Approximate)   BMI 21.79 kg/m   Weight change: +2#  Height: 5' 6.25" (168.3 cm)  Ht Readings from Last 3 Encounters:  05/12/16 5' 6.25" (1.683 m)  04/24/15 5' 6.5" (1.689 m)  03/13/14 5' 6.5" (1.689 m)    General appearance: alert, cooperative and appears stated age Head: Normocephalic, without obvious abnormality, atraumatic Neck: no adenopathy, supple, symmetrical, trachea midline and thyroid normal to inspection and palpation Lungs: clear to auscultation bilaterally Breasts: normal appearance, no masses or tenderness Heart: regular rate and rhythm Abdomen: soft, non-tender; bowel sounds normal; no masses,  no organomegaly Extremities: extremities normal, atraumatic, no cyanosis or edema Skin: Skin color, texture, turgor normal. No rashes or lesions Lymph nodes: Cervical, supraclavicular, and axillary nodes normal. No abnormal inguinal nodes palpated Neurologic: Grossly normal  Pelvic: External genitalia:  no lesions              Urethra:  normal appearing urethra with no masses, tenderness or lesions              Bartholins and Skenes: normal                 Vagina: normal appearing vagina with normal color and discharge, anterior cysts noted, stable in size              Cervix: polyp noted  Pap taken:  Bimanual Exam:  Uterus:  normal size, contour, position, consistency, mobility, non-tender              Adnexa: normal adnexa               Rectovaginal: Confirms               Anus:  normal sphincter tone, no lesions  Polyp removal:  Verbal consent obtained.  Polyp grasped with ringed forceps and twisted at base.  Silver nitrate applied at base for hemostasis.  Pt tolerated procedure well.  Chaperone was present for exam.  A:   Well Woman with normal exam  Perimenopausal symptoms, still on OCPs Anterior vaginal wall cysts, present for several years Cervical polyp, s/p removal today  P:  Mammogram yearly Pap and HR HPV  today FSH today Hep C antibody obtained today.  Vit D also obtained. Polyp will be sent to pathology.  Results will be called to pt. Return annually or prn

## 2016-05-13 LAB — HEPATITIS C ANTIBODY: HCV Ab: NEGATIVE

## 2016-05-13 LAB — VITAMIN D 25 HYDROXY (VIT D DEFICIENCY, FRACTURES): VIT D 25 HYDROXY: 16 ng/mL — AB (ref 30–100)

## 2016-05-13 LAB — FOLLICLE STIMULATING HORMONE: FSH: 7.6 m[IU]/mL

## 2016-05-14 LAB — IPS PAP TEST WITH HPV

## 2016-05-14 LAB — IPS OTHER TISSUE BIOPSY

## 2016-05-18 NOTE — Addendum Note (Signed)
Addended by: Megan Salon on: 05/18/2016 07:48 AM   Modules accepted: Orders

## 2016-05-21 LAB — IPS HPV GENOTYPING 16/18

## 2016-05-26 NOTE — Addendum Note (Signed)
Addended by: Megan Salon on: 05/26/2016 07:13 AM   Modules accepted: Orders

## 2016-05-27 ENCOUNTER — Telehealth: Payer: Self-pay | Admitting: *Deleted

## 2016-05-27 MED ORDER — VITAMIN D (ERGOCALCIFEROL) 1.25 MG (50000 UNIT) PO CAPS: 50000.0000 [IU] | ORAL_CAPSULE | ORAL | 0 refills | Status: DC

## 2016-05-27 NOTE — Telephone Encounter (Signed)
-----   Message from Megan Salon, MD sent at 05/26/2016  7:12 AM EST ----- Please let pt know her pap was normal but HR HPV was positive.  16/18/45 was negative so she needs repeat pap 1 year.  Needs 08 recall.    Hep C was negative  FSH was 7.6.  At 31, this is unusually low.  I'd like to repeat this after she finishes the next pack of OCPs.  She should not restart the pack and be off for one week (use condoms if needed) and repeat the [facility].  Order has been placed.  Ok for her to do this in January as well if too much to do during the holidays.  Lastly, her Vit D is low.  She needs 50K weekly x 12 weeks and then repeat the level at 12 weeks.  Order has been placed for this lab but not for Rx.  Thanks.

## 2016-05-27 NOTE — Telephone Encounter (Signed)
Call to patient. Reviewed all lab results with patient and she verbalized understanding. Patient states her pills will end on December 7th and she will then begin the placebo pills. Due to patient's schedule, lab visit for repeat [facility] scheduled for Tuesday 06/10/16 at [place]. Patient agreeable to date and time. Patient aware to use condoms in the interim. Patient aware of need for prescription for vitamin d. Patient requests prescription [facility] on [address] Shelly Bombard reviewed how to take with patient and she verbalized understanding. Repeat lab appointment scheduled for Tuesday 08/26/16 at 0830. Patient agreeable to date and time of appointment. Future order present for both labs. Lastly, 08 recall entered into EPIC.    Routing to provider for final review. Patient agreeable to disposition. Will close encounter.

## 2016-06-10 ENCOUNTER — Other Ambulatory Visit (INDEPENDENT_AMBULATORY_CARE_PROVIDER_SITE_OTHER): Payer: BLUE CROSS/BLUE SHIELD

## 2016-06-10 ENCOUNTER — Telehealth: Payer: Self-pay | Admitting: *Deleted

## 2016-06-10 DIAGNOSIS — Z124 Encounter for screening for malignant neoplasm of cervix: Secondary | ICD-10-CM

## 2016-06-10 DIAGNOSIS — Z0000 Encounter for general adult medical examination without abnormal findings: Secondary | ICD-10-CM

## 2016-06-10 NOTE — Telephone Encounter (Signed)
Message left to return call to Emily at [phone].    

## 2016-06-10 NOTE — Telephone Encounter (Signed)
Patient returned call. After review of biopsy results with Dr. Sabra Heck, patient notified of benign polyp. Patient verbalized understanding.    Routing to provider for final review. Patient agreeable to disposition. Will close encounter.

## 2016-06-11 LAB — FOLLICLE STIMULATING HORMONE: FSH: 64.1 m[IU]/mL

## 2016-08-06 ENCOUNTER — Other Ambulatory Visit: Payer: Self-pay | Admitting: Obstetrics & Gynecology

## 2016-08-06 NOTE — Telephone Encounter (Signed)
Medication refill request: vitamin D Last AEX:  05/12/16 SM Next AEX: 05/28/17 SM  Last MMG (if hormonal medication request): 04/11/16 BIRADS1:Neg  Refill authorized: 05/27/16 #12/0R. Has lab appt to repeat Vit D 08/26/16  Please advise.

## 2016-08-08 ENCOUNTER — Telehealth: Payer: Self-pay | Admitting: Obstetrics & Gynecology

## 2016-08-08 NOTE — Telephone Encounter (Signed)
Spoke with patient. Patient states she received message from pharmacy that she had a prescription for Vit D ready. Patient states she has been taking Vit D weekly as prescribed since December 2017. Patient states she is scheduled for a repeat lab August 26, 2016. Patient states she is unsure why more was needed. Advised patient a refill request submitted 08/06/16 for Vit D. Patient states she is not aware of needing any additional Vit D and has to go to a meeting and can no longer discuss. Please return call later with details, call disconnected.   Dr. Sabra Heck -ok to discontinue new order placed for Vit D?  Cc: Lowell Bouton  [place], [place]   8:00 AM  Note    Medication refill request: vitamin D Last AEX:  05/12/16 SM Next AEX: 05/28/17 SM  Last MMG (if hormonal medication request): 04/11/16 BIRADS1:Neg  Refill authorized: 05/27/16 #12/0R. Has lab appt to repeat Vit D 08/26/16  Please advise.

## 2016-08-08 NOTE — Telephone Encounter (Signed)
Patient is calling to speak with the nurse regarding a prescription for Vit D. She states this was called into the pharmacy this morning and she does not know why.

## 2016-08-08 NOTE — Telephone Encounter (Signed)
Spoke with Brandy Mcdonald, confirmed refill message was sent by pharmacy.   Call to patient, advised no completed previously prescribed doses of Vit D 50,000 IU, keep f/u lab appointment for recheck 08/26/16. Patient verbalizes understanding and is agreeable.  Routing to provider for final review. Patient is agreeable to disposition. Will close encounter.

## 2016-08-26 ENCOUNTER — Other Ambulatory Visit (INDEPENDENT_AMBULATORY_CARE_PROVIDER_SITE_OTHER): Payer: BLUE CROSS/BLUE SHIELD

## 2016-08-26 DIAGNOSIS — E559 Vitamin D deficiency, unspecified: Secondary | ICD-10-CM

## 2016-08-27 LAB — VITAMIN D 25 HYDROXY (VIT D DEFICIENCY, FRACTURES): VIT D 25 HYDROXY: 32 ng/mL (ref 30–100)

## 2017-01-09 ENCOUNTER — Encounter: Payer: Self-pay | Admitting: Obstetrics and Gynecology

## 2017-01-09 ENCOUNTER — Ambulatory Visit (INDEPENDENT_AMBULATORY_CARE_PROVIDER_SITE_OTHER): Payer: BLUE CROSS/BLUE SHIELD | Admitting: Obstetrics and Gynecology

## 2017-01-09 ENCOUNTER — Other Ambulatory Visit: Payer: Self-pay | Admitting: Obstetrics and Gynecology

## 2017-01-09 ENCOUNTER — Telehealth: Payer: Self-pay | Admitting: Obstetrics & Gynecology

## 2017-01-09 VITALS — BP 110/60 | HR 60 | Resp 16 | Ht 66.25 in | Wt 140.0 lb

## 2017-01-09 DIAGNOSIS — N939 Abnormal uterine and vaginal bleeding, unspecified: Secondary | ICD-10-CM | POA: Diagnosis not present

## 2017-01-09 NOTE — Progress Notes (Signed)
GYNECOLOGY  VISIT   HPI: 56 y.o.   Married  Caucasian  female   G0P0000 with Patient's last menstrual period was 05/03/2016 (approximate).   here for vaginal bleeding. Last night started bleeding.  Changed a thin pad a couple of times this morning.  Little cramping.  Feels just like a period.  Some breast sensitivity and bloating feeling.   LMP was end of 2017.  [place] was 7.6 on 05/12/16 while she was on OCPs.  [place] was 64.1 on 06/10/17 after she stopped her OCPs.  Has vaginal cysts.   Not sexually active.   GYNECOLOGIC HISTORY: Patient's last menstrual period was 05/03/2016 (approximate). Contraception: post menopausal  Menopausal hormone therapy:  none Last mammogram:  04/11/16 BIRADS1:neg  Last pap smear:  05/12/16 Neg. HR HPV:+detected. #16, 18/45 neg          OB History    Gravida Para Term Preterm AB Living   0 0 0 0 0 0   SAB TAB Ectopic Multiple Live Births   0 0 0 0           Patient Active Problem List   Diagnosis Date Noted  . Menopausal symptoms 05/12/2016    Past Medical History:  Diagnosis Date  . GERD (gastroesophageal reflux disease)    years ago-no meds  . Poison ivy    over the summer, ? if truly poison ivy-2 rounds of steroids    Past Surgical History:  Procedure Laterality Date  . TONSILLECTOMY AND ADENOIDECTOMY  age 21    Current Outpatient Prescriptions  Medication Sig Dispense Refill  . cholecalciferol (VITAMIN D) 1000 units tablet Take 1,000 Units by mouth daily.    . DiphenhydrAMINE HCl (ALLERGY MEDICATION PO) Take by mouth daily.    Marland Kitchen OVER THE COUNTER MEDICATION Iron with vitamin C N-36 folic acid and copper take daily     No current facility-administered medications for this visit.      ALLERGIES: Penicillins  No family history on file.  Social History   Social History  . Marital status: Married    Spouse name: N/A  . Number of children: N/A  . Years of education: N/A   Occupational History  . Not on file.   Social  History Main Topics  . Smoking status: Never Smoker  . Smokeless tobacco: Never Used  . Alcohol use No  . Drug use: No  . Sexual activity: No   Other Topics Concern  . Not on file   Social History Narrative  . No narrative on file    ROS:  Pertinent items are noted in HPI.  PHYSICAL EXAMINATION:    BP 110/60 (BP Location: Right Arm, Patient Position: Sitting, Cuff Size: Normal)   Pulse 60   Resp 16   Ht 5' 6.25" (1.683 m)   Wt 140 lb (63.5 kg)   LMP 05/03/2016 (Approximate)   BMI 22.43 kg/m     General appearance: alert, cooperative and appears stated age   Pelvic: External genitalia:  no lesions              Urethra:  normal appearing urethra with no masses, tenderness or lesions              Bartholins and Skenes: normal                 Vagina: normal appearing vagina with normal color and discharge, no lesions.  Two 1 cm vaginal cysts.  One left sided and one right sided.  Nontender.               Cervix: no lesions.  Blood form the os.  No active bleeding.                 Bimanual Exam:  Uterus:  normal size, contour, position, consistency, mobility, non-tender              Adnexa: no mass, fullness, tenderness     Chaperone was present for exam.  ASSESSMENT  Perimenopausal female.  Has not gone one year without a menstruation.  Not sexually active.   PLAN  We discussed uterine bleeding and potential etiologies - menses, polyp, fibroid, infection, precancer and cancer.  Will check FSH and estradiol and determine if needs to return to see Dr. Sabra Heck for sonohysterogram and EMB.  She understands that she may still need these procedures performed.    An After Visit Summary was printed and given to the patient.  _15_____ minutes face to face time of which over 50% was spent in counseling.

## 2017-01-09 NOTE — Telephone Encounter (Signed)
Patient reports LMP end of 2017, hormone levels in December showed menopause, stopped OCP, no bleeding since. Started spotting yesterday and now bleeding like a full menstrual cycle, was advised to call with any future bleeding. Denies any other gyn or urinary symptoms. Recommended OV for further evaluation.   Advised patient Dr. Sabra Heck is out of the office, can schedule with covering provider. Patient scheduled for today at 10:45am with Dr. Quincy Simmonds. Patient is agreeable to date and time.   Routing to provider for final review. Patient is agreeable to disposition. Will close encounter.  Cc: Dr. Sabra Heck

## 2017-01-09 NOTE — Patient Instructions (Signed)
I will contact you about your test results.  If you are menopausal by your hormonal testing, you will need to return for a pelvic ultrasound and endometrial biopsy.  We likely would do an ultrasound where we put sterile saline fluid in the uterine cavity so we can check for polyps and fibroid growths.  If you are not menopausal, I will have you record your menstrual cycles and report back to Dr. Sabra Heck.

## 2017-01-09 NOTE — Telephone Encounter (Signed)
Patient called with concerns that her menstrual cycle started late yesterday, 01/08/17. She said she has not had a cycle "in years."  Last seen: 05/12/16

## 2017-01-10 LAB — FOLLICLE STIMULATING HORMONE: FSH: 11 m[IU]/mL

## 2017-01-10 LAB — ESTRADIOL: ESTRADIOL: 61 pg/mL

## 2017-01-13 ENCOUNTER — Telehealth: Payer: Self-pay | Admitting: *Deleted

## 2017-01-13 LAB — FOLLICLE STIMULATING HORMONE: FSH: 11 m[IU]/mL

## 2017-01-13 LAB — ESTRADIOL: ESTRADIOL: 61 pg/mL

## 2017-01-13 NOTE — Telephone Encounter (Signed)
-----   Message from Nunzio Cobbs, MD sent at 01/12/2017  5:38 PM EDT ----- Regarding: Delay in lab results Brandy Mcdonald,   This patient of Dr. Sabra Heck was seen on 01/09/17 by me on 01/09/17 for bleeding.  She has not had a menses since November 2017, and I chose to check her [facility] and estradiol instead of doing an EMB immediately.   Her labs are still not back this evening.   I hope you are able to help get these results.  Brandy Mcdonald  Cc- Dr. Sabra Heck

## 2017-01-13 NOTE — Telephone Encounter (Signed)
Lab results faxed from [facility] (see scanned copy) and reviewed by Dr Quincy Simmonds.  [place] 11.0 Estradilol 61.0 Call to patient and advised she is not menopausal and will not need endometrial biopsy. Should consider this bleeding as a cycle and continue to monitor cycles. Call if heavy, prolonged or unusual. Scheduled for annual with Dr Sabra Heck om 05-28-17 and instructed to bring calendar to appointment for review. Advised Dr Quincy Simmonds will send to Dr Sabra Heck for review and we will call back if any additional instructions.    Routing to provider for final review. Patient agreeable to disposition. Will close encounter.    CC: Dr Sabra Heck

## 2017-03-11 ENCOUNTER — Other Ambulatory Visit: Payer: Self-pay | Admitting: Obstetrics & Gynecology

## 2017-03-11 DIAGNOSIS — Z1231 Encounter for screening mammogram for malignant neoplasm of breast: Secondary | ICD-10-CM

## 2017-04-13 ENCOUNTER — Ambulatory Visit
Admission: RE | Admit: 2017-04-13 | Discharge: 2017-04-13 | Disposition: A | Discharge status: Home / Self Care | Payer: BLUE CROSS/BLUE SHIELD | Source: Ambulatory Visit | Attending: Obstetrics & Gynecology | Admitting: Obstetrics & Gynecology

## 2017-04-13 DIAGNOSIS — Z1231 Encounter for screening mammogram for malignant neoplasm of breast: Secondary | ICD-10-CM

## 2017-05-05 ENCOUNTER — Ambulatory Visit: Payer: BLUE CROSS/BLUE SHIELD | Admitting: Obstetrics & Gynecology

## 2017-05-27 NOTE — Progress Notes (Signed)
56 y.o. G0P0000 MarriedCaucasianF here for annual exam.  Has not cycled since August.  Having hot flashes and night sweats.    Has lipoma on each arm that she would like to have removed.  PCP:  Dr. Ma Hillock   Patient's last menstrual period was 02/07/2016.          Sexually active: No.  The current method of family planning is post menopausal status.    Exercising: No.  The patient does not participate in regular exercise at present. Smoker:  no  Health Maintenance: Pap:  05/12/16 Neg. HR HPV:+detected. HPV #16, 18/45 Neg  03/13/14 Neg  History of abnormal Pap:  yes MMG:  04/13/17 BIRADS1:neg  Colonoscopy:  01/14/13 polyps. F/u 5 years  BMD:   Never TDaP: 2008. Will get one today.  Pneumonia vaccine(s):  No Zostavax: No Hep C testing: 05/12/16 neg  Screening Labs: only if needed   reports that  has never smoked. she has never used smokeless tobacco. She reports that she does not drink alcohol or use drugs.  Past Medical History:  Diagnosis Date  . GERD (gastroesophageal reflux disease)    years ago-no meds  . Poison ivy    over the summer, ? if truly poison ivy-2 rounds of steroids    Past Surgical History:  Procedure Laterality Date  . TONSILLECTOMY AND ADENOIDECTOMY  age 70    Current Outpatient Medications  Medication Sig Dispense Refill  . cyanocobalamin 100 MCG tablet Take 100 mcg by mouth daily.    . cholecalciferol (VITAMIN D) 1000 units tablet Take 1,000 Units by mouth daily.    Marland Kitchen OVER THE COUNTER MEDICATION Iron with vitamin C G-83 folic acid and copper take daily     No current facility-administered medications for this visit.     Family History  Problem Relation Age of Onset  . Breast cancer Paternal Aunt     ROS:  Pertinent items are noted in HPI.  Otherwise, a comprehensive ROS was negative.  Exam:   BP 100/60 (BP Location: Right Arm, Patient Position: Sitting, Cuff Size: Normal)   Pulse 64   Resp 16   Ht 5' 6.5" (1.689 m)   Wt 130 lb (59 kg)    LMP 02/07/2016   BMI 20.67 kg/m     Height: 5' 6.5" (168.9 cm)  Ht Readings from Last 3 Encounters:  05/28/17 5' 6.5" (1.689 m)  01/09/17 5' 6.25" (1.683 m)  05/12/16 5' 6.25" (1.683 m)    General appearance: alert, cooperative and appears stated age Head: Normocephalic, without obvious abnormality, atraumatic Neck: no adenopathy, supple, symmetrical, trachea midline and thyroid normal to inspection and palpation Lungs: clear to auscultation bilaterally Breasts: normal appearance, no masses or tenderness Heart: regular rate and rhythm Abdomen: soft, non-tender; bowel sounds normal; no masses,  no organomegaly Extremities:about 2.5cm lipoma on underside of each arm, atraumatic, no cyanosis or edema Skin: Skin color, texture, turgor normal. No rashes or lesions Lymph nodes: Cervical, supraclavicular, and axillary nodes normal. No abnormal inguinal nodes palpated Neurologic: Grossly normal   Pelvic: External genitalia:  no lesions              Urethra:  normal appearing urethra with no masses, tenderness or lesions              Bartholins and Skenes: normal                 Vagina: normal appearing vagina with normal color and discharge, no lesions  Cervix: no lesions              Pap taken: Yes.   Bimanual Exam:  Uterus:  normal size, contour, position, consistency, mobility, non-tender              Adnexa: normal adnexa and no mass, fullness, tenderness               Rectovaginal: Confirms               Anus:  normal sphincter tone, no lesions  Chaperone was present for exam.  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

## 2017-05-28 ENCOUNTER — Ambulatory Visit: Payer: BLUE CROSS/BLUE SHIELD | Admitting: Obstetrics & Gynecology

## 2017-05-28 ENCOUNTER — Encounter: Payer: Self-pay | Admitting: Obstetrics & Gynecology

## 2017-05-28 ENCOUNTER — Other Ambulatory Visit (HOSPITAL_COMMUNITY)
Admission: RE | Admit: 2017-05-28 | Discharge: 2017-05-28 | Disposition: A | Discharge status: Home / Self Care | Payer: BLUE CROSS/BLUE SHIELD | Source: Ambulatory Visit | Attending: Obstetrics & Gynecology | Admitting: Obstetrics & Gynecology

## 2017-05-28 ENCOUNTER — Other Ambulatory Visit: Payer: Self-pay

## 2017-05-28 VITALS — BP 100/60 | HR 64 | Resp 16 | Ht 66.5 in | Wt 130.0 lb

## 2017-05-28 DIAGNOSIS — Z01411 Encounter for gynecological examination (general) (routine) with abnormal findings: Secondary | ICD-10-CM

## 2017-05-28 DIAGNOSIS — B977 Papillomavirus as the cause of diseases classified elsewhere: Secondary | ICD-10-CM

## 2017-05-28 DIAGNOSIS — Z124 Encounter for screening for malignant neoplasm of cervix: Secondary | ICD-10-CM

## 2017-05-28 DIAGNOSIS — D1720 Benign lipomatous neoplasm of skin and subcutaneous tissue of unspecified limb: Secondary | ICD-10-CM

## 2017-05-28 DIAGNOSIS — Z0000 Encounter for general adult medical examination without abnormal findings: Secondary | ICD-10-CM | POA: Diagnosis not present

## 2017-05-28 DIAGNOSIS — Z23 Encounter for immunization: Secondary | ICD-10-CM

## 2017-05-28 DIAGNOSIS — N912 Amenorrhea, unspecified: Secondary | ICD-10-CM | POA: Diagnosis not present

## 2017-05-29 LAB — CYTOLOGY - PAP
DIAGNOSIS: NEGATIVE
HPV (WINDOPATH): NOT DETECTED

## 2017-05-29 LAB — CBC
HEMATOCRIT: 39 % (ref 34.0–46.6)
Hemoglobin: 13.4 g/dL (ref 11.1–15.9)
MCH: 32.1 pg (ref 26.6–33.0)
MCHC: 34.4 g/dL (ref 31.5–35.7)
MCV: 94 fL (ref 79–97)
Platelets: 302 10*3/uL (ref 150–379)
RBC: 4.17 x10E6/uL (ref 3.77–5.28)
RDW: 13 % (ref 12.3–15.4)
WBC: 4.7 10*3/uL (ref 3.4–10.8)

## 2017-05-29 LAB — COMPREHENSIVE METABOLIC PANEL
A/G RATIO: 2.1 (ref 1.2–2.2)
ALT: 21 IU/L (ref 0–32)
AST: 18 IU/L (ref 0–40)
Albumin: 4.9 g/dL (ref 3.5–5.5)
Alkaline Phosphatase: 74 IU/L (ref 39–117)
BILIRUBIN TOTAL: 0.4 mg/dL (ref 0.0–1.2)
BUN/Creatinine Ratio: 24 — ABNORMAL HIGH (ref 9–23)
BUN: 16 mg/dL (ref 6–24)
CALCIUM: 10.2 mg/dL (ref 8.7–10.2)
CHLORIDE: 100 mmol/L (ref 96–106)
CO2: 26 mmol/L (ref 20–29)
Creatinine, Ser: 0.66 mg/dL (ref 0.57–1.00)
GFR calc Af Amer: 115 mL/min/{1.73_m2} (ref >59)
GFR, EST NON AFRICAN AMERICAN: 100 mL/min/{1.73_m2} (ref >59)
GLOBULIN, TOTAL: 2.3 g/dL (ref 1.5–4.5)
Glucose: 80 mg/dL (ref 65–99)
POTASSIUM: 4.7 mmol/L (ref 3.5–5.2)
SODIUM: 143 mmol/L (ref 134–144)
Total Protein: 7.2 g/dL (ref 6.0–8.5)

## 2017-05-29 LAB — LIPID PANEL
CHOL/HDL RATIO: 3.3 ratio (ref 0.0–4.4)
Cholesterol, Total: 197 mg/dL (ref 100–199)
HDL: 59 mg/dL (ref >39)
LDL Calculated: 113 mg/dL — ABNORMAL HIGH (ref 0–99)
Triglycerides: 127 mg/dL (ref 0–149)
VLDL Cholesterol Cal: 25 mg/dL (ref 5–40)

## 2017-05-29 LAB — VITAMIN D 25 HYDROXY (VIT D DEFICIENCY, FRACTURES): Vit D, 25-Hydroxy: 23.9 ng/mL — ABNORMAL LOW (ref 30.0–100.0)

## 2017-05-29 LAB — FOLLICLE STIMULATING HORMONE: FSH: 110.4 m[IU]/mL

## 2017-05-29 LAB — TSH: TSH: 1.04 u[IU]/mL (ref 0.450–4.500)

## 2017-06-17 ENCOUNTER — Telehealth: Payer: Self-pay | Admitting: Obstetrics & Gynecology

## 2017-06-17 NOTE — Telephone Encounter (Signed)
Encounter opened in error

## 2017-09-07 ENCOUNTER — Encounter: Payer: Self-pay | Admitting: Medical

## 2017-09-07 ENCOUNTER — Ambulatory Visit: Payer: BLUE CROSS/BLUE SHIELD | Admitting: Medical

## 2017-09-07 VITALS — BP 122/70 | HR 79 | Temp 97.6°F | Ht 66.0 in | Wt 133.0 lb

## 2017-09-07 DIAGNOSIS — M62830 Muscle spasm of back: Secondary | ICD-10-CM | POA: Diagnosis not present

## 2017-09-07 DIAGNOSIS — S39012A Strain of muscle, fascia and tendon of lower back, initial encounter: Secondary | ICD-10-CM | POA: Diagnosis not present

## 2017-09-07 MED ORDER — IBUPROFEN 600 MG PO TABS: 600.0000 mg | ORAL_TABLET | Freq: Three times a day (TID) | ORAL | 0 refills | Status: DC | PRN

## 2017-09-07 MED ORDER — CYCLOBENZAPRINE HCL 5 MG PO TABS: 5.0000 mg | ORAL_TABLET | Freq: Three times a day (TID) | ORAL | 0 refills | Status: DC | PRN

## 2017-09-07 NOTE — Patient Instructions (Signed)
You exam and symptoms suggest back strain/spasm  Specific recommendations to help your current back pain include: Rest your back for the next 5-7 days Avoid strenuous activity or heavy lifting for the next 5-7 days You may use heat to your back such as a hot shower, moist hot towel, or heat pad 2-3 times per day short-term Consider going to a massage therapist  You can use Ibuprofen for pain and inflammation 3 tablets, two times per day for the next 3-5 days then as needed.  (Caution-I do not intend for you to use anti-inflammatories such as ibuprofen, Aleve, or Advil on a regular basis due to risk of gastric bleeding or damage to the liver or kidney)  You may use the muscle relaxer Flexeril prescribed to help reduce spasm and tension in your muscles.  Primarily use this as bedtime as it may cause drowsiness.  If you will be home for several hours during the day you may use 1/2 tablet during the day.  Do not drive or operate machinery while taking this medication.   General recommendations to help prevent back problems and to keep your back strong and healthy: I recommend you get 150 minutes of aerobic exercise per week such as walking, running, swimming, dancing, golf or other exercise. I recommend you do a general stretching routine daily. To prevent back issues, I recommend you use some core strengthening exercises as we discussed.  For example, do the following regimen at least 2 days per week:  perform 2-3 sets of 25 in each of abdominal crunches or sit ups  perform 2-3 sets of 25 in each of core twists  perform 2-3 sets of 25 in each of upright rows  perform 2-3 sets of 25 in each of dead lifts Drink plenty of water throughout the day so that your urine is clear If you smoke, I strongly recommend you quit smoking as this causes inflammation throughout the body including your back  Please note: If you experience worsening or significantly different symptoms in the near future then  call or return immediately If you develop fever, urinary changes, bowel changes, body aches and chills, then get reevaluated immediately by either calling, returning, or going to the urgent care or emergency department after hours or weekend If you have severe back pain compared to today's visit, or numbness of the genitalia, incontinence of bowel or bladder, or develop the inability to walk or stand, then go to the emergency department immediately  Follow up: as needed

## 2017-09-07 NOTE — Progress Notes (Signed)
Subjective:    Brandy Mcdonald is a 57 y.o. female who presents for evaluation of low back pain.  Symptoms have been present for a few hours and are unchanged.  She was at work lifting tubs of mail.   Felt a pull in her back after 1 of these tubs.  She notified her boss who directed her to the onsite doctor.   They recommend she come and see her PCP.   Onset was related to / precipitated by lifting a heavy object. The pain is located in the left lumbar area and does not radiate. The pain is described as aching and soreness and occurs intermittent, worse if bending or lifting.   She is currently in mild pain. Symptoms are exacerbated by extension and lifting. Symptoms are improved by rest.  She did take some ibuprofen earlier and this is helping some.  She has no other symptoms associated with the back pain. The patient has no "red flag" history indicative of complicated back pain..  Denies saddle anesthesia or incontinence.  The following portions of the patient's history were reviewed and updated as appropriate: allergies, current medications, past family history, past medical history, past social history, past surgical history and problem list.  Review of Systems Constitutional: denies fever, chills, sweats, unexpected weight change, anorexia, fatigue Dermatology: rash, bruising, redness Cardiology: denies chest pain, palpitations, edema Respiratory: denies cough, shortness of breath, dyspnea Gastroenterology: denies abdominal pain, nausea, vomiting, diarrhea, constipation, blood in stool, changes in bowel movement, dysphagia Musculoskeletal: denies arthralgias, joint swelling, neck pain, cramping Urology: denies dysuria, difficulty urinating, hematuria, urinary frequency, urgency, incontinence      Objective:    BP 122/70 (BP Location: Right Arm, Patient Position: Standing, Cuff Size: Normal)   Pulse 79   Temp 97.6 F (36.4 C) (Oral)   Ht 5\' 6"  (1.676 m)   Wt 133 lb (60.3 kg)   LMP  05/03/2016 (Approximate)   SpO2 97%   BMI 21.47 kg/m   General appearance: alert, no distress, WD/WN,white female Abdomen: +bs, soft, non tender, non distended, no masses, no hepatomegaly, no splenomegaly, no bruits Back with no obvious deformity, back nontender, pain with back extension which is only mildly limited, othewrise nontender and normal ROM.  -SLR MSK: legs nontender, normal ROM, no obvious deformity Extremities: no edema, no cyanosis, no clubbing Pulses: 2+ symmetric, upper and lower extremities     Assessment:   Encounter Diagnoses  Name Primary?  . Back strain, initial encounter Yes  . Muscle spasm of back       Plan:   Symptoms and exam suggests lumbar back strain and spasm.   Discussed differential for low back pain.     Recommendations Use rest with no heavy lifting for the next 7-10 days You can use gentle stretching and range of motion activity to avoid stiffness and to help stretch the muscles You can use heat such as warm towel, hot shower, or hot tub  You can consider getting a massage Begin Ibuprofen OTC 3 tablets BID for pain and inflammation x 4-5 days. Take this with food. You can use the muscle relaxer Flexeril, at bedtime or up to twice daily.  Caution as this can make you drowsy.  If not gradually seeing improvement over the next 4-5 days, or of worse or new symptoms, then recheck right away.  Follow up prn  Brandy Mcdonald was seen today for back pain.  Diagnoses and all orders for this visit:  Back strain, initial encounter  Muscle spasm  of back  Other orders -     cyclobenzaprine (FLEXERIL) 5 MG tablet; Take 1 tablet (5 mg total) by mouth 3 (three) times daily as needed for muscle spasms. -     ibuprofen (ADVIL,MOTRIN) 600 MG tablet; Take 1 tablet (600 mg total) by mouth every 8 (eight) hours as needed.

## 2017-10-27 ENCOUNTER — Other Ambulatory Visit: Payer: Self-pay | Admitting: Family Medicine

## 2017-10-27 ENCOUNTER — Ambulatory Visit: Payer: Self-pay

## 2017-10-27 DIAGNOSIS — M533 Sacrococcygeal disorders, not elsewhere classified: Secondary | ICD-10-CM

## 2018-03-24 ENCOUNTER — Other Ambulatory Visit: Payer: Self-pay | Admitting: Obstetrics & Gynecology

## 2018-03-24 DIAGNOSIS — Z1231 Encounter for screening mammogram for malignant neoplasm of breast: Secondary | ICD-10-CM

## 2018-04-27 ENCOUNTER — Ambulatory Visit: Payer: Self-pay

## 2018-05-11 ENCOUNTER — Ambulatory Visit
Admission: RE | Admit: 2018-05-11 | Discharge: 2018-05-11 | Disposition: A | Discharge status: Home / Self Care | Payer: BLUE CROSS/BLUE SHIELD | Source: Ambulatory Visit | Attending: Obstetrics & Gynecology | Admitting: Obstetrics & Gynecology

## 2018-05-11 DIAGNOSIS — Z1231 Encounter for screening mammogram for malignant neoplasm of breast: Secondary | ICD-10-CM

## 2018-05-13 ENCOUNTER — Other Ambulatory Visit: Payer: Self-pay | Admitting: Obstetrics & Gynecology

## 2018-05-13 DIAGNOSIS — R928 Other abnormal and inconclusive findings on diagnostic imaging of breast: Secondary | ICD-10-CM

## 2018-05-24 ENCOUNTER — Other Ambulatory Visit: Payer: Self-pay

## 2018-05-24 ENCOUNTER — Ambulatory Visit
Admission: RE | Admit: 2018-05-24 | Discharge: 2018-05-24 | Disposition: A | Discharge status: Home / Self Care | Payer: BLUE CROSS/BLUE SHIELD | Source: Ambulatory Visit | Attending: Obstetrics & Gynecology | Admitting: Obstetrics & Gynecology

## 2018-05-24 DIAGNOSIS — R928 Other abnormal and inconclusive findings on diagnostic imaging of breast: Secondary | ICD-10-CM

## 2018-06-03 ENCOUNTER — Other Ambulatory Visit: Payer: Self-pay | Admitting: Obstetrics & Gynecology

## 2018-06-03 ENCOUNTER — Other Ambulatory Visit (HOSPITAL_COMMUNITY)
Admission: RE | Admit: 2018-06-03 | Discharge: 2018-06-03 | Disposition: A | Discharge status: Home / Self Care | Payer: BLUE CROSS/BLUE SHIELD | Source: Ambulatory Visit | Attending: Obstetrics & Gynecology | Admitting: Obstetrics & Gynecology

## 2018-06-03 ENCOUNTER — Telehealth: Payer: Self-pay | Admitting: *Deleted

## 2018-06-03 ENCOUNTER — Ambulatory Visit: Payer: BLUE CROSS/BLUE SHIELD | Admitting: Obstetrics & Gynecology

## 2018-06-03 ENCOUNTER — Encounter: Payer: Self-pay | Admitting: Obstetrics & Gynecology

## 2018-06-03 VITALS — BP 106/58 | HR 84 | Resp 16 | Ht 66.25 in | Wt 129.0 lb

## 2018-06-03 DIAGNOSIS — Z124 Encounter for screening for malignant neoplasm of cervix: Secondary | ICD-10-CM

## 2018-06-03 DIAGNOSIS — B977 Papillomavirus as the cause of diseases classified elsewhere: Secondary | ICD-10-CM | POA: Insufficient documentation

## 2018-06-03 DIAGNOSIS — Z862 Personal history of diseases of the blood and blood-forming organs and certain disorders involving the immune mechanism: Secondary | ICD-10-CM

## 2018-06-03 DIAGNOSIS — Z01419 Encounter for gynecological examination (general) (routine) without abnormal findings: Secondary | ICD-10-CM

## 2018-06-03 NOTE — Telephone Encounter (Signed)
Left message on Lisa's voicemail to call back re: patient's next colonoscopy date.

## 2018-06-03 NOTE — Progress Notes (Signed)
57 y.o. G0P0000 Married White or Caucasian female here for annual exam.  Denies vaginal bleeding.  Moved to [place], [place] in an apartment for a year.  Living in a hotel for the next two months until finishes his notice in the end of January.  They are both going to retire.  His mother in law passed this year.    Denies vaginal bleeding.  Did have some hot flashes last year.    H/o anemia.  Desires to check iron and Hb today.  Patient's last menstrual period was 05/03/2016 (approximate).          Sexually active: No.  The current method of family planning is post menopausal status.    Exercising: No.   Smoker:  no  Health Maintenance: Pap:  05/28/17 Neg. HR HPV:neg   05/12/16 Neg. HR HPV:+detected. #16, 18/45 Neg  History of abnormal Pap:  yes MMG: 05/24/18 [place] Left BIRADS2:Benign. F/u 1 year  Colonoscopy:  01/14/13 polyps. F/u 5 years  BMD:   Never TDaP:  2018 Pneumonia vaccine(s):  n/a Shingrix:   No Hep C testing: 05/12/16 neg  Screening Labs: if needed   reports that she has never smoked. She has never used smokeless tobacco. She reports that she does not drink alcohol or use drugs.  Past Medical History:  Diagnosis Date  . GERD (gastroesophageal reflux disease)    years ago-no meds  . Poison ivy    over the summer, ? if truly poison ivy-2 rounds of steroids    Past Surgical History:  Procedure Laterality Date  . TONSILLECTOMY AND ADENOIDECTOMY  age 67    Current Outpatient Medications  Medication Sig Dispense Refill  . cholecalciferol (VITAMIN D) 1000 units tablet Take 1,000 Units by mouth daily.    . cyanocobalamin 100 MCG tablet Take 100 mcg by mouth daily.    . diphenhydrAMINE (ALLERGY) 25 mg capsule Take 25 mg by mouth every 8 (eight) hours as needed.    . Ferrous Gluconate (IRON 27 PO) Take by mouth daily.    . Multiple Vitamin (MULTIVITAMIN) tablet Take 1 tablet by mouth daily.     No current facility-administered medications for this visit.     Family  History  Problem Relation Age of Onset  . Breast cancer Paternal Aunt 27    Review of Systems  All other systems reviewed and are negative.   Exam:   BP (!) 106/58 (BP Location: Right Arm, Patient Position: Sitting, Cuff Size: Normal)   Pulse 84   Resp 16   Ht 5' 6.25" (1.683 m)   Wt 129 lb (58.5 kg)   LMP 05/03/2016 (Approximate)   BMI 20.66 kg/m    Height: 5' 6.25" (168.3 cm)  Ht Readings from Last 3 Encounters:  06/03/18 5' 6.25" (1.683 m)  09/07/17 5\' 6"  (1.676 m)  05/28/17 5' 6.5" (1.689 m)    General appearance: alert, cooperative and appears stated age Head: Normocephalic, without obvious abnormality, atraumatic Neck: no adenopathy, supple, symmetrical, trachea midline and thyroid normal to inspection and palpation Lungs: clear to auscultation bilaterally Breasts: normal appearance, no masses or tenderness Heart: regular rate and rhythm Abdomen: soft, non-tender; bowel sounds normal; no masses,  no organomegaly Extremities: extremities normal, atraumatic, no cyanosis or edema Skin: Skin color, texture, turgor normal. No rashes or lesions Lymph nodes: Cervical, supraclavicular, and axillary nodes normal. No abnormal inguinal nodes palpated Neurologic: Grossly normal   Pelvic: External genitalia:  no lesions  Urethra:  normal appearing urethra with no masses, tenderness or lesions              Bartholins and Skenes: normal                 Vagina: two small anterior wall cysts, normal appearing vagina with normal color and discharge, no lesions              Cervix: no lesions              Pap taken: Yes.   Bimanual Exam:  Uterus:  normal size, contour, position, consistency, mobility, non-tender              Adnexa: normal adnexa and no mass, fullness, tenderness               Rectovaginal: Confirms               Anus:  normal sphincter tone, no lesions  Chaperone was present for exam.  A:  Well Woman with normal exam PMP, no HRT Anterior vaginal  wall cysts, decreased in size this past year likely due to menopause Vit D deficiency H/O HR HPV 2017 with neg 16/18/45 testing.  Neg pap and neg HR HPV 2018.   H/O arm lipomas that were removed last year  P:   Mammogram guidelines reviewed pap smear with HR HPV obtained today CBC and iron levels today.  Plan fasting blood work 1-2 years Reviewed Shingrix vaccination.  She is going to wait on this for the time being until they are moved.  Will check with GI about colonoscopy follow up.  Had hyperplastic polyp in 2014 so I suspect this is a 10 year follow up. Pt will have screening labs as recommended with PCP once she moves fully to [facility]. Return annually or prn

## 2018-06-04 LAB — CYTOLOGY - PAP
DIAGNOSIS: NEGATIVE
HPV (WINDOPATH): NOT DETECTED

## 2018-06-04 LAB — CBC
HEMATOCRIT: 35.7 % (ref 34.0–46.6)
Hemoglobin: 12.2 g/dL (ref 11.1–15.9)
MCH: 32.6 pg (ref 26.6–33.0)
MCHC: 34.2 g/dL (ref 31.5–35.7)
MCV: 96 fL (ref 79–97)
Platelets: 265 10*3/uL (ref 150–450)
RBC: 3.74 x10E6/uL — ABNORMAL LOW (ref 3.77–5.28)
RDW: 11.3 % — ABNORMAL LOW (ref 12.3–15.4)
WBC: 4.6 10*3/uL (ref 3.4–10.8)

## 2018-06-04 LAB — IRON,TIBC AND FERRITIN PANEL
Ferritin: 190 ng/mL — ABNORMAL HIGH (ref 15–150)
Iron Saturation: 34 % (ref 15–55)
Iron: 100 ug/dL (ref 27–159)
Total Iron Binding Capacity: 293 ug/dL (ref 250–450)
UIBC: 193 ug/dL (ref 131–425)

## 2018-06-04 NOTE — Telephone Encounter (Signed)
Called patient. Left voicemail re: Colonoscopy due 2024.  Dr. Lestine Box Encounter closed.

## 2018-06-04 NOTE — Telephone Encounter (Signed)
I spoke with Brandy Mcdonald. Patient is due for her 10 year colonoscopy in 12/2022. Brandy Mcdonald stated that she would fax over the report for our records.

## 2018-06-08 LAB — IRON,TIBC AND FERRITIN PANEL
Ferritin: 190 ng/mL — ABNORMAL HIGH (ref 15–150)
IRON SATURATION: 34 % (ref 15–55)
IRON: 100 ug/dL (ref 27–159)
Total Iron Binding Capacity: 293 ug/dL (ref 250–450)
UIBC: 193 ug/dL (ref 131–425)

## 2018-06-08 LAB — CBC
HEMATOCRIT: 35.7 % (ref 34.0–46.6)
Hemoglobin: 12.2 g/dL (ref 11.1–15.9)
MCH: 32.6 pg (ref 26.6–33.0)
MCHC: 34.2 g/dL (ref 31.5–35.7)
MCV: 96 fL (ref 79–97)
PLATELETS: 265 10*3/uL (ref 150–450)
RBC: 3.74 x10E6/uL — AB (ref 3.77–5.28)
RDW: 11.3 % — ABNORMAL LOW (ref 12.3–15.4)
WBC: 4.6 10*3/uL (ref 3.4–10.8)

## 2020-05-14 IMAGING — MG DIGITAL SCREENING BILATERAL MAMMOGRAM WITH TOMO AND CAD
8 series · 9 of 24 positions shown · non-contrast
Comparison: Previous exam(s).

CLINICAL DATA: Screening.

EXAM:
DIGITAL SCREENING BILATERAL MAMMOGRAM WITH TOMO AND CAD

[R CC synth-2D]
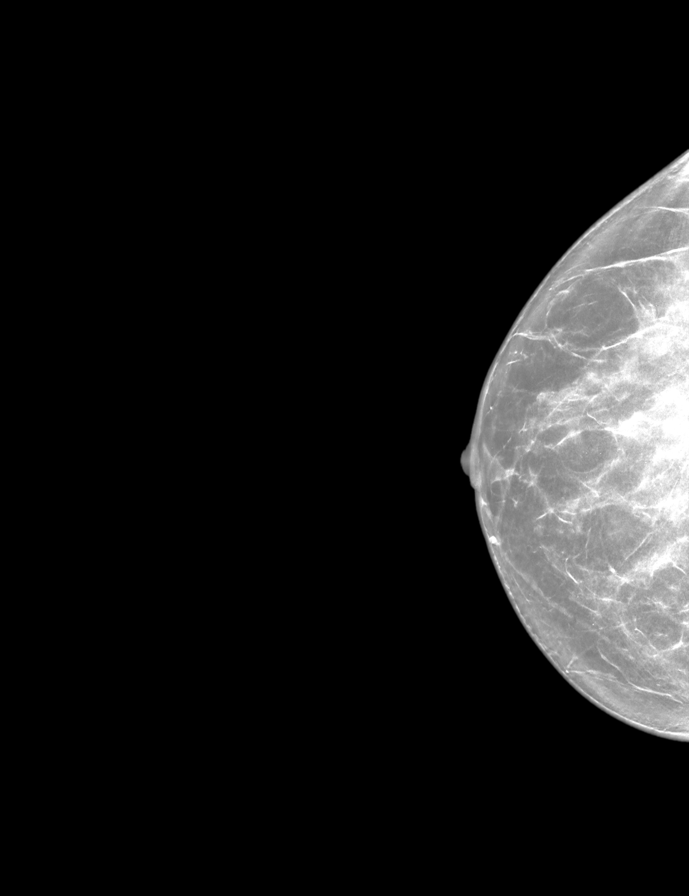

[L MLO synth-2D]
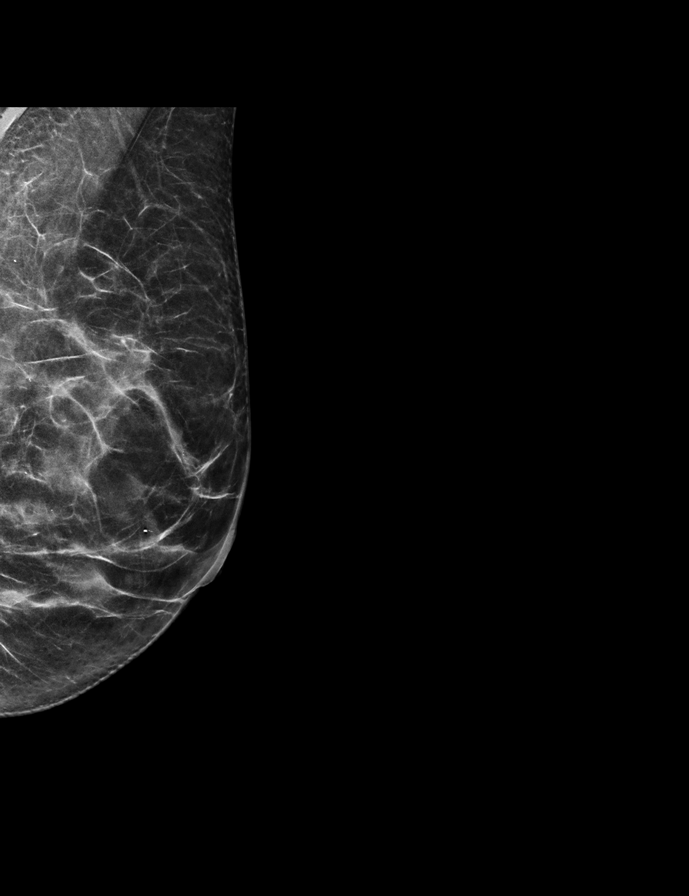

[R MLO synth-2D]
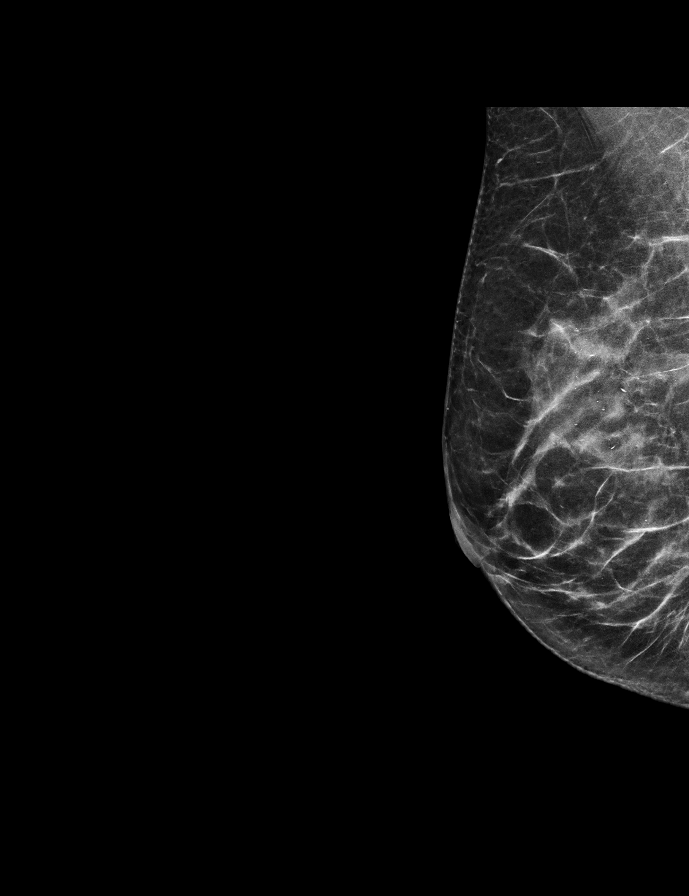

[L CC synth-2D]
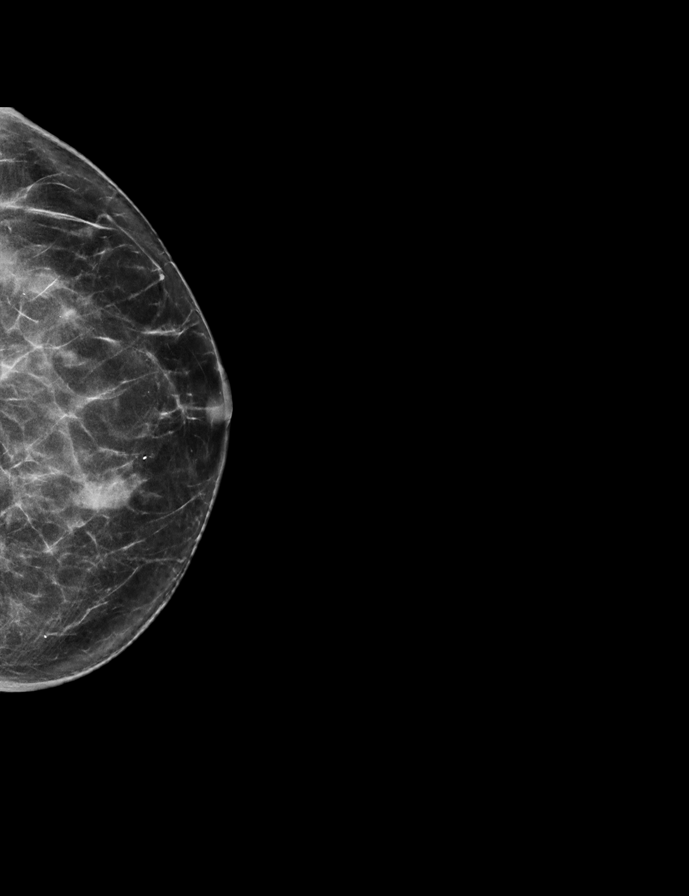

[R CC tomo · 2 of 63 frames shown]
[frame 21/63]
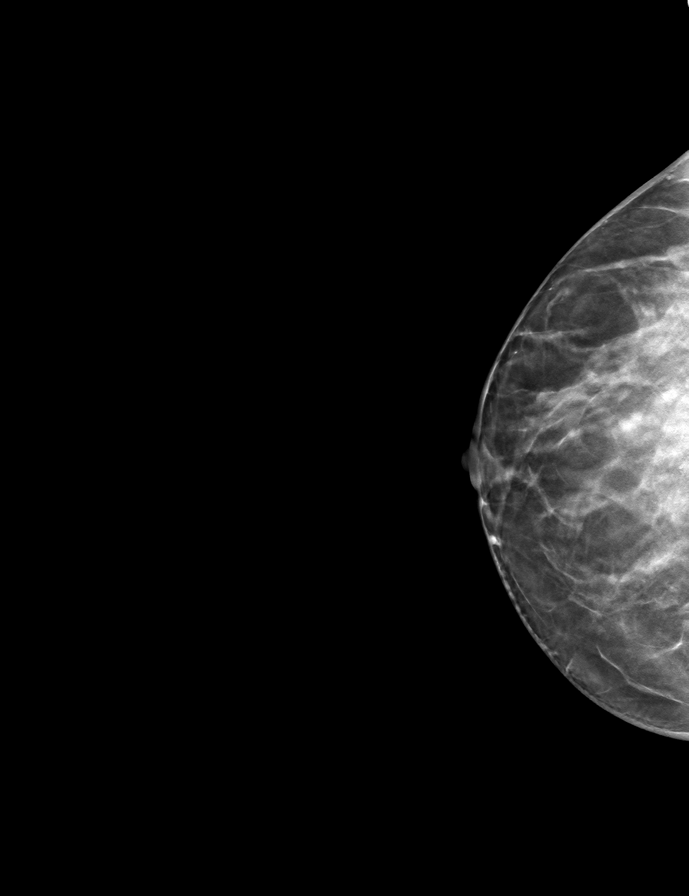
[frame 32/63]
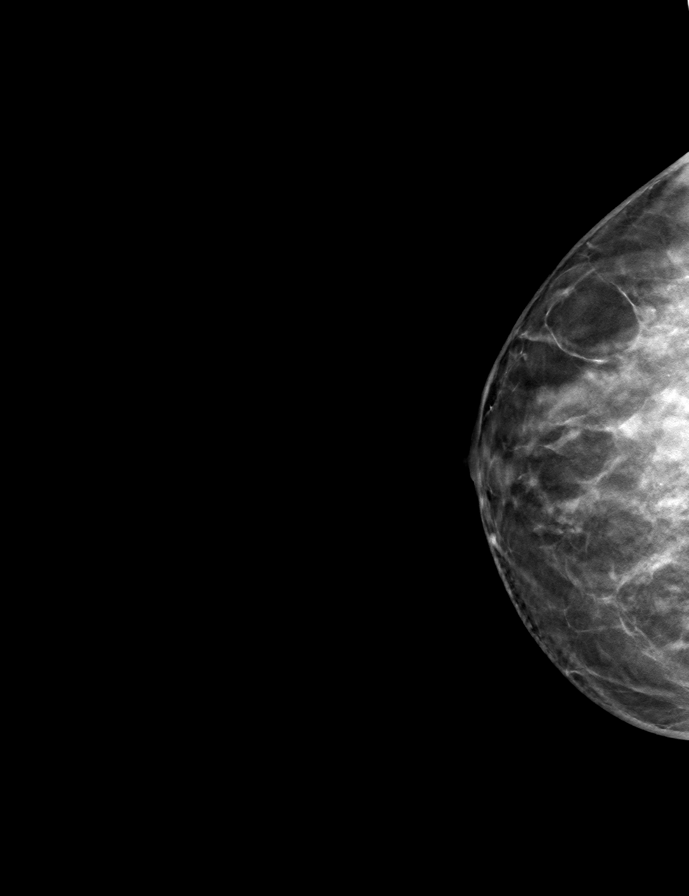

[L CC tomo · tomo slice 32/63.0]
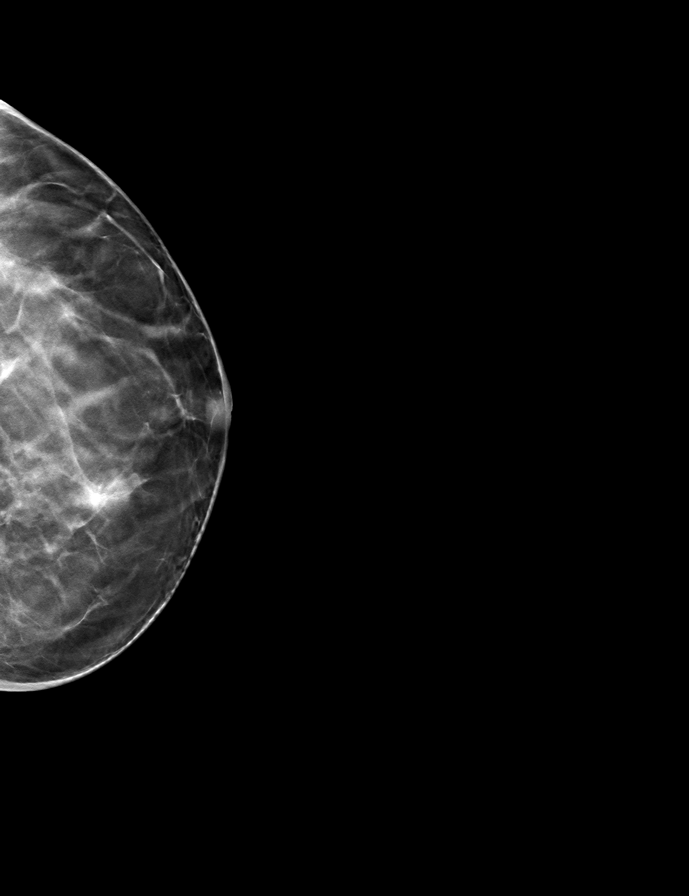

[R MLO tomo · tomo slice 29/56.0]
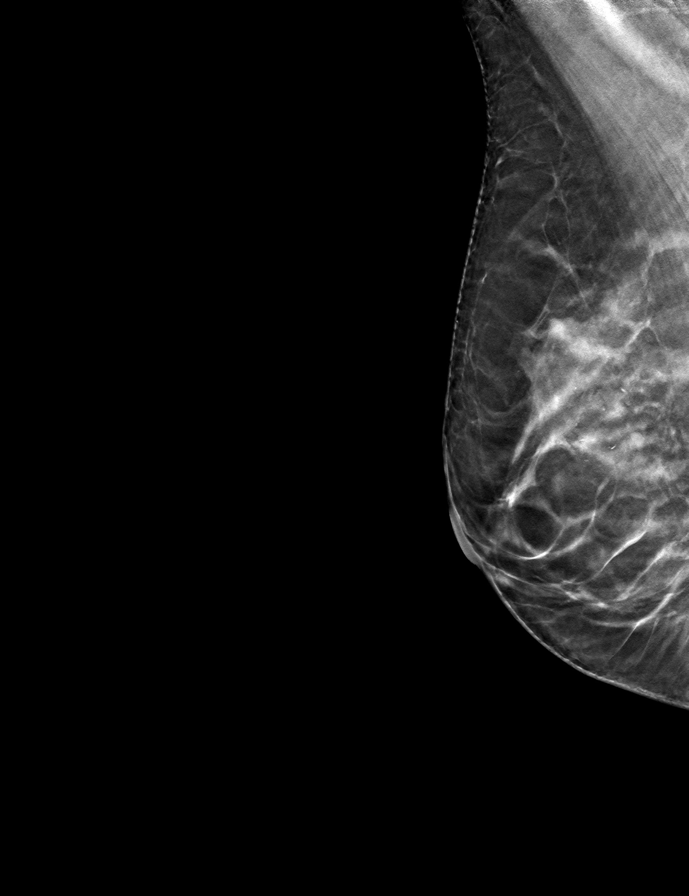

[L MLO tomo · tomo slice 29/57.0]
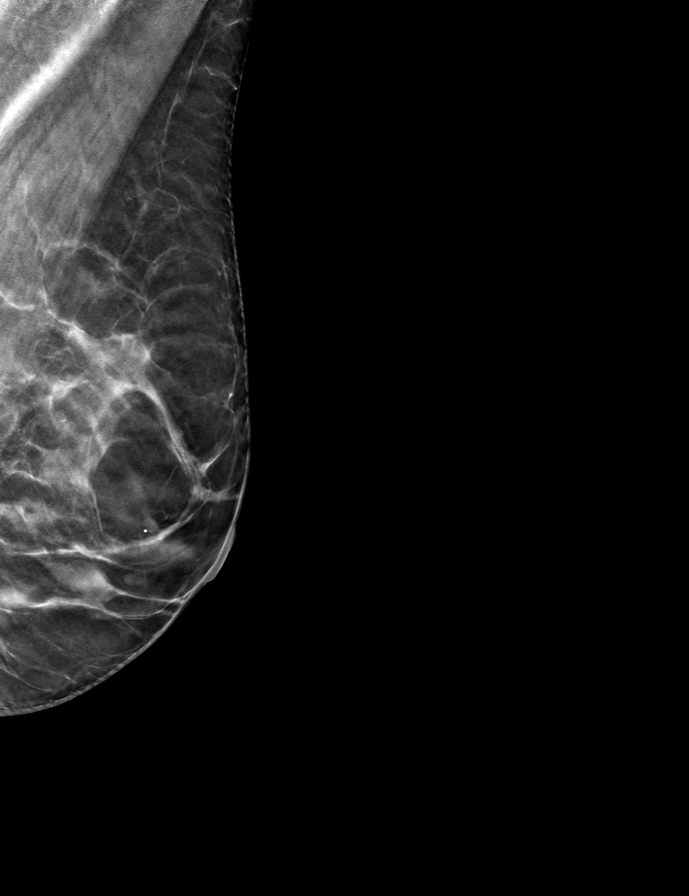

[9 of 24 positions shown; findings below may reference images not displayed]

ACR Breast Density Category c: The breast tissue is heterogeneously
dense, which may obscure small masses.
FINDINGS: In the left breast, a possible mass warrants further evaluation. In
the right breast, no findings suspicious for malignancy.

Images were processed with CAD.
IMPRESSION: Further evaluation is suggested for possible mass in the left
breast.

RECOMMENDATION:
Diagnostic mammogram and possibly ultrasound of the left breast.
(Code:IJ-8-HHX)

The patient will be contacted regarding the findings, and additional
imaging will be scheduled.

BI-RADS CATEGORY  0: Incomplete. Need additional imaging evaluation
and/or prior mammograms for comparison.
